# Patient Record
Sex: Female | Born: 1980
Health system: Southern US, Community
[De-identification: ages and names within clinical notes are randomized; demographics above are authoritative.]

## PROBLEM LIST (undated history)

## (undated) DIAGNOSIS — R7301 Impaired fasting glucose: Secondary | ICD-10-CM

## (undated) DIAGNOSIS — E663 Overweight: Secondary | ICD-10-CM

## (undated) DIAGNOSIS — F429 Obsessive-compulsive disorder, unspecified: Secondary | ICD-10-CM

## (undated) DIAGNOSIS — F17200 Nicotine dependence, unspecified, uncomplicated: Secondary | ICD-10-CM

## (undated) DIAGNOSIS — K589 Irritable bowel syndrome without diarrhea: Secondary | ICD-10-CM

## (undated) DIAGNOSIS — N870 Mild cervical dysplasia: Secondary | ICD-10-CM

## (undated) DIAGNOSIS — F988 Other specified behavioral and emotional disorders with onset usually occurring in childhood and adolescence: Secondary | ICD-10-CM

## (undated) DIAGNOSIS — J309 Allergic rhinitis, unspecified: Secondary | ICD-10-CM

## (undated) DIAGNOSIS — I7300 Raynaud's syndrome without gangrene: Secondary | ICD-10-CM

## (undated) HISTORY — DX: Mild cervical dysplasia: N87.0

## (undated) HISTORY — PX: COLPOSCOPY: SHX161

## (undated) HISTORY — DX: Allergic rhinitis, unspecified: J30.9

## (undated) HISTORY — DX: Nicotine dependence, unspecified, uncomplicated: F17.200

## (undated) HISTORY — DX: Impaired fasting glucose: R73.01

## (undated) HISTORY — DX: Other specified behavioral and emotional disorders with onset usually occurring in childhood and adolescence: F98.8

## (undated) HISTORY — DX: Raynaud's syndrome without gangrene: I73.00

## (undated) HISTORY — DX: Irritable bowel syndrome, unspecified: K58.9

## (undated) HISTORY — DX: Overweight: E66.3

## (undated) HISTORY — DX: Obsessive-compulsive disorder, unspecified: F42.9

---

## 1999-03-14 ENCOUNTER — Other Ambulatory Visit: Admission: RE | Admit: 1999-03-14 | Discharge: 1999-03-14 | Payer: Self-pay | Admitting: Obstetrics and Gynecology

## 2000-09-02 ENCOUNTER — Other Ambulatory Visit: Admission: RE | Admit: 2000-09-02 | Discharge: 2000-09-02 | Payer: Self-pay | Admitting: Gynecology

## 2001-12-09 ENCOUNTER — Other Ambulatory Visit: Admission: RE | Admit: 2001-12-09 | Discharge: 2001-12-09 | Payer: Self-pay | Admitting: Gynecology

## 2002-12-17 ENCOUNTER — Other Ambulatory Visit: Admission: RE | Admit: 2002-12-17 | Discharge: 2002-12-17 | Payer: Self-pay | Admitting: Gynecology

## 2003-12-30 ENCOUNTER — Other Ambulatory Visit: Admission: RE | Admit: 2003-12-30 | Discharge: 2003-12-30 | Payer: Self-pay | Admitting: Gynecology

## 2004-05-26 ENCOUNTER — Other Ambulatory Visit: Admission: RE | Admit: 2004-05-26 | Discharge: 2004-05-26 | Payer: Self-pay | Admitting: Obstetrics and Gynecology

## 2005-01-03 ENCOUNTER — Other Ambulatory Visit: Admission: RE | Admit: 2005-01-03 | Discharge: 2005-01-03 | Payer: Self-pay | Admitting: Obstetrics and Gynecology

## 2005-09-14 ENCOUNTER — Other Ambulatory Visit: Admission: RE | Admit: 2005-09-14 | Discharge: 2005-09-14 | Payer: Self-pay | Admitting: Obstetrics and Gynecology

## 2006-02-01 ENCOUNTER — Other Ambulatory Visit: Admission: RE | Admit: 2006-02-01 | Discharge: 2006-02-01 | Payer: Self-pay | Admitting: Obstetrics and Gynecology

## 2007-03-24 ENCOUNTER — Other Ambulatory Visit: Admission: RE | Admit: 2007-03-24 | Discharge: 2007-03-24 | Payer: Self-pay | Admitting: Obstetrics and Gynecology

## 2008-03-09 ENCOUNTER — Encounter: Admission: RE | Admit: 2008-03-09 | Discharge: 2008-03-09 | Payer: Self-pay | Admitting: Family Medicine

## 2008-04-07 ENCOUNTER — Other Ambulatory Visit: Admission: RE | Admit: 2008-04-07 | Discharge: 2008-04-07 | Payer: Self-pay | Admitting: Obstetrics and Gynecology

## 2009-04-29 ENCOUNTER — Encounter: Payer: Self-pay | Admitting: Obstetrics and Gynecology

## 2009-04-29 ENCOUNTER — Ambulatory Visit: Payer: Self-pay | Admitting: Obstetrics and Gynecology

## 2009-04-29 ENCOUNTER — Other Ambulatory Visit: Admission: RE | Admit: 2009-04-29 | Discharge: 2009-04-29 | Payer: Self-pay | Admitting: Obstetrics and Gynecology

## 2010-06-08 ENCOUNTER — Other Ambulatory Visit: Admission: RE | Admit: 2010-06-08 | Discharge: 2010-06-08 | Payer: Self-pay | Admitting: Obstetrics and Gynecology

## 2010-06-08 ENCOUNTER — Ambulatory Visit: Payer: Self-pay | Admitting: Obstetrics and Gynecology

## 2010-10-08 ENCOUNTER — Encounter: Payer: Self-pay | Admitting: Gastroenterology

## 2011-05-30 DIAGNOSIS — N870 Mild cervical dysplasia: Secondary | ICD-10-CM | POA: Insufficient documentation

## 2011-05-30 DIAGNOSIS — K589 Irritable bowel syndrome without diarrhea: Secondary | ICD-10-CM | POA: Insufficient documentation

## 2011-06-13 ENCOUNTER — Encounter: Payer: Self-pay | Admitting: Obstetrics and Gynecology

## 2011-06-13 ENCOUNTER — Other Ambulatory Visit (HOSPITAL_COMMUNITY)
Admission: RE | Admit: 2011-06-13 | Discharge: 2011-06-13 | Disposition: A | Discharge status: Home / Self Care | Payer: PRIVATE HEALTH INSURANCE | Source: Ambulatory Visit | Attending: Obstetrics and Gynecology | Admitting: Obstetrics and Gynecology

## 2011-06-13 ENCOUNTER — Ambulatory Visit (INDEPENDENT_AMBULATORY_CARE_PROVIDER_SITE_OTHER): Payer: PRIVATE HEALTH INSURANCE | Admitting: Obstetrics and Gynecology

## 2011-06-13 DIAGNOSIS — Z01419 Encounter for gynecological examination (general) (routine) without abnormal findings: Secondary | ICD-10-CM | POA: Insufficient documentation

## 2011-06-13 DIAGNOSIS — N390 Urinary tract infection, site not specified: Secondary | ICD-10-CM

## 2011-06-13 DIAGNOSIS — Z113 Encounter for screening for infections with a predominantly sexual mode of transmission: Secondary | ICD-10-CM

## 2011-06-13 NOTE — Progress Notes (Signed)
Addended by: Landis Martins R on: 06/13/2011 10:06 AM   Modules accepted: Orders

## 2011-06-13 NOTE — Progress Notes (Signed)
Patient came to see me today for her annual GYN exam. She's done well with her NuvaRing. She's been with a new partner for 3 months. He is larger when they have intercourse. She has noticed since she's been with him some urgency and frequency of urination. It's actually better now.  Physical examination: HEENT within normal limits. Neck: Thyroid not large. No masses. Supraclavicular nodes: not enlarged. Breasts: Examined in both sitting midline position. No skin changes and no masses. Abdomen: Soft no guarding rebound or masses or hernia. Pelvic: External: Within normal limits. BUS: Within normal limits. Vaginal:within normal limits. Good estrogen effect. No evidence of cystocele rectocele or enterocele. Cervix: clean. Uterus: Normal size and shape. Adnexa: No masses. Rectovaginal exam: Confirmatory and negative. Extremities: Within normal limits.  Assessment: Possible UTI in spite of a negative urine today.  Plan: Based on her symptoms I have treated her with Macrobid twice a day with food for 7 days. We did a urine culture. We discussed her emptying her bladder after intercourse. We discussed post coital antibiotics but will not start yet. We checked her for GC and Chlamydia but she will donate blood for the other STD testing. She will continue her NuvaRing and a yearly prescription was written.

## 2011-07-03 ENCOUNTER — Other Ambulatory Visit: Payer: Self-pay | Admitting: Obstetrics and Gynecology

## 2011-08-22 ENCOUNTER — Telehealth: Payer: Self-pay | Admitting: *Deleted

## 2011-08-22 NOTE — Telephone Encounter (Signed)
Patient states she has accidentally taken her Nuvaring out a week early.  (next week was supposed to be her placebo week) So now that she's on her period this week should she put in new ring the end of this week and carry on just like this would be her regular time to replace? Any concerns she should have?

## 2011-08-22 NOTE — Telephone Encounter (Signed)
I assume she only had her NuvaRing in for 2 weeks. Please verify. I would leave it out for 5 days and then put a new ring in for 3 weeks. Just to be sure I would use a condom with intercourse.

## 2011-08-22 NOTE — Telephone Encounter (Signed)
Patient informed will pick up sample at front desk.  Will insert this Sunday.  She did indeed have it in for two weeks.

## 2012-06-26 ENCOUNTER — Other Ambulatory Visit: Payer: Self-pay | Admitting: Obstetrics and Gynecology

## 2012-07-09 ENCOUNTER — Other Ambulatory Visit (HOSPITAL_COMMUNITY)
Admission: RE | Admit: 2012-07-09 | Discharge: 2012-07-09 | Disposition: A | Discharge status: Home / Self Care | Payer: PRIVATE HEALTH INSURANCE | Source: Ambulatory Visit | Attending: Obstetrics and Gynecology | Admitting: Obstetrics and Gynecology

## 2012-07-09 ENCOUNTER — Encounter: Payer: Self-pay | Admitting: Obstetrics and Gynecology

## 2012-07-09 ENCOUNTER — Ambulatory Visit (INDEPENDENT_AMBULATORY_CARE_PROVIDER_SITE_OTHER): Payer: PRIVATE HEALTH INSURANCE | Admitting: Obstetrics and Gynecology

## 2012-07-09 VITALS — BP 116/72 | Ht 65.0 in | Wt 136.0 lb

## 2012-07-09 DIAGNOSIS — Z113 Encounter for screening for infections with a predominantly sexual mode of transmission: Secondary | ICD-10-CM

## 2012-07-09 DIAGNOSIS — Z01419 Encounter for gynecological examination (general) (routine) without abnormal findings: Secondary | ICD-10-CM | POA: Insufficient documentation

## 2012-07-09 DIAGNOSIS — Z1151 Encounter for screening for human papillomavirus (HPV): Secondary | ICD-10-CM | POA: Insufficient documentation

## 2012-07-09 LAB — CBC WITH DIFFERENTIAL/PLATELET
Eosinophils Absolute: 0.1 10*3/uL (ref 0.0–0.7)
Eosinophils Relative: 1 % (ref 0–5)
Hemoglobin: 14.3 g/dL (ref 12.0–15.0)
Lymphocytes Relative: 31 % (ref 12–46)
Lymphs Abs: 3.2 10*3/uL (ref 0.7–4.0)
MCH: 31 pg (ref 26.0–34.0)
MCV: 92 fL (ref 78.0–100.0)
Monocytes Relative: 9 % (ref 3–12)
Platelets: 337 10*3/uL (ref 150–400)
RBC: 4.62 MIL/uL (ref 3.87–5.11)
WBC: 10.3 10*3/uL (ref 4.0–10.5)

## 2012-07-09 MED ORDER — ETONOGESTREL-ETHINYL ESTRADIOL 0.12-0.015 MG/24HR VA RING: VAGINAL_RING | VAGINAL | Status: DC

## 2012-07-09 NOTE — Patient Instructions (Signed)
Try to stop smoking. °

## 2012-07-09 NOTE — Progress Notes (Signed)
Patient came to see me today for her annual GYN exam. She is having regular cycles with her NuvaRing. She continues to try to stop smoking and has reduced it significantly. She's currently not sexually active but has been since her last visit here. 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 Since 2006 she has had yearly normal Pap smears. She is having no pelvic pain. She is having no unusual bleeding.  Physical examination:Angel Casey present. HEENT within normal limits. Neck: Thyroid not large. No masses. Supraclavicular nodes: not enlarged. Breasts: Examined in both sitting and lying  position. No skin changes and no masses. Abdomen: Soft no guarding rebound or masses or hernia. Pelvic: External: Within normal limits. BUS: Within normal limits. Vaginal:within normal limits. Good estrogen effect. No evidence of cystocele rectocele or enterocele. Cervix: clean. Uterus: Normal size and shape. Adnexa: No masses. Rectovaginal exam: Confirmatory and negative. Extremities: Within normal limits.  Assessment: #1. Normal GYN exam #2. CIN-1 now resolved  Plan: Continue nuvaring. Stop smoking. Pap and highrisk HPV testing done.The new Pap smear guidelines were discussed with the patient.

## 2012-07-10 LAB — URINALYSIS W MICROSCOPIC + REFLEX CULTURE
Hgb urine dipstick: NEGATIVE
Leukocytes, UA: NEGATIVE
Nitrite: NEGATIVE
Protein, ur: NEGATIVE mg/dL
Squamous Epithelial / LPF: NONE SEEN
Urobilinogen, UA: 0.2 mg/dL (ref 0.0–1.0)

## 2012-07-10 LAB — GC/CHLAMYDIA PROBE AMP, GENITAL: Chlamydia, DNA Probe: NEGATIVE

## 2012-07-17 ENCOUNTER — Telehealth: Payer: Self-pay | Admitting: *Deleted

## 2012-07-17 NOTE — Telephone Encounter (Signed)
Pt informed with recent blood work on 07/09/12.

## 2013-03-18 ENCOUNTER — Telehealth: Payer: Self-pay | Admitting: *Deleted

## 2013-03-18 NOTE — Telephone Encounter (Signed)
PT CALLED REQUESTING SAMPLE OF NUVARING. PT WILL COME BY TO PICK UP 1 SAMPLE.

## 2013-07-15 ENCOUNTER — Encounter: Payer: Self-pay | Admitting: Women's Health

## 2013-07-15 ENCOUNTER — Ambulatory Visit (INDEPENDENT_AMBULATORY_CARE_PROVIDER_SITE_OTHER): Payer: PRIVATE HEALTH INSURANCE | Admitting: Women's Health

## 2013-07-15 VITALS — BP 114/74 | Ht 65.0 in | Wt 141.4 lb

## 2013-07-15 DIAGNOSIS — IMO0001 Reserved for inherently not codable concepts without codable children: Secondary | ICD-10-CM

## 2013-07-15 DIAGNOSIS — Z309 Encounter for contraceptive management, unspecified: Secondary | ICD-10-CM

## 2013-07-15 DIAGNOSIS — Z01419 Encounter for gynecological examination (general) (routine) without abnormal findings: Secondary | ICD-10-CM

## 2013-07-15 DIAGNOSIS — Z113 Encounter for screening for infections with a predominantly sexual mode of transmission: Secondary | ICD-10-CM

## 2013-07-15 DIAGNOSIS — N870 Mild cervical dysplasia: Secondary | ICD-10-CM

## 2013-07-15 LAB — CBC WITH DIFFERENTIAL/PLATELET
Hemoglobin: 13.5 g/dL (ref 12.0–15.0)
Lymphocytes Relative: 42 % (ref 12–46)
Lymphs Abs: 2.7 10*3/uL (ref 0.7–4.0)
MCH: 31.6 pg (ref 26.0–34.0)
MCV: 91.3 fL (ref 78.0–100.0)
Monocytes Relative: 8 % (ref 3–12)
Neutrophils Relative %: 48 % (ref 43–77)
Platelets: 361 10*3/uL (ref 150–400)
RBC: 4.27 MIL/uL (ref 3.87–5.11)
WBC: 6.4 10*3/uL (ref 4.0–10.5)

## 2013-07-15 LAB — HIV ANTIBODY (ROUTINE TESTING W REFLEX): HIV: NONREACTIVE

## 2013-07-15 LAB — HEPATITIS B SURFACE ANTIGEN: Hepatitis B Surface Ag: NEGATIVE

## 2013-07-15 MED ORDER — ETONOGESTREL-ETHINYL ESTRADIOL 0.12-0.015 MG/24HR VA RING: VAGINAL_RING | VAGINAL | Status: DC

## 2013-07-15 NOTE — Progress Notes (Signed)
Angel Casey 1981/04/11 [id number]  History:    The patient presents for annual exam.  Monthly cycle on NuvaRing. Quit smoking 10 months ago. CIN-1 2005 with normal Paps after. Pap negative with negative HR HPV 06/2012. History of IBS. Gardasil series completed when 24. New partner, history of HSV on Valtrex suppression.  Past medical history, past surgical history, family history and social history were all reviewed and documented in the EPIC chart. Works for. [employer].   ROS:  A  ROS was performed and pertinent positives and negatives are included in the history.  Exam:  Filed Vitals:   07/15/13 0833  BP: 114/74    General appearance:  Normal Head/Neck:  Normal, without cervical or supraclavicular adenopathy. Thyroid:  Symmetrical, normal in size, without palpable masses or nodularity. Respiratory  Effort:  Normal  Auscultation:  Clear without wheezing or rhonchi Cardiovascular  Auscultation:  Regular rate, without rubs, murmurs or gallops  Edema/varicosities:  Not grossly evident Abdominal  Soft,nontender, without masses, guarding or rebound.  Liver/spleen:  No organomegaly noted  Hernia:  None appreciated  Skin  Inspection:  Grossly normal  Palpation:  Grossly normal Neurologic/psychiatric  Orientation:  Normal with appropriate conversation.  Mood/affect:  Normal  Genitourinary    Breasts: Examined lying and sitting.     Right: Without masses, retractions, discharge or axillary adenopathy.     Left: Without masses, retractions, discharge or axillary adenopathy.   Inguinal/mons:  Normal without inguinal adenopathy  External genitalia:  Normal  BUS/Urethra/Skene's glands:  Normal  Bladder:  Normal  Vagina:  Normal  Cervix:  Normal  Uterus:  normal in size, shape and contour.  Midline and mobile  Adnexa/parametria:     Rt: Without masses or tenderness.   Lt: Without masses or tenderness.  Anus and perineum: Normal  Digital rectal exam: Normal sphincter  tone without palpated masses or tenderness  Assessment/Plan:  32 y.o.SWF G0  for annual exam.    CIN 1 2005 normal Paps after STD screen  Plan: NuvaRing prescription, proper use, slight risk for blood clots and strokes reviewed. Condoms encouraged until permanent partner.  SBE's, continue regular exercise, calcium rich diet, MVI daily encouraged. CBC, UA, GC/Chlamydia, HIV, hep B, C., RPR, HSV IgG IGM. Pap normal with negative HR HPV 2013, new screening guidelines reviewed.    Harrington Challenger [facility], 12:50 PM 07/15/2013

## 2013-07-15 NOTE — Patient Instructions (Signed)

## 2013-07-16 LAB — GC/CHLAMYDIA PROBE AMP
CT Probe RNA: NEGATIVE
GC Probe RNA: NEGATIVE

## 2013-07-16 LAB — HSV(HERPES SMPLX)ABS-I+II(IGG+IGM)-BLD
HSV 1 Glycoprotein G Ab, IgG: 0.12 IV
Herpes Simplex Vrs I&II-IgM Ab (EIA): 0.76 INDEX

## 2013-07-16 LAB — URINALYSIS W MICROSCOPIC + REFLEX CULTURE
Bacteria, UA: NONE SEEN
Casts: NONE SEEN
Crystals: NONE SEEN
Glucose, UA: NEGATIVE mg/dL
Hgb urine dipstick: NEGATIVE
Leukocytes, UA: NEGATIVE
Specific Gravity, Urine: 1.01 (ref 1.005–1.030)
Squamous Epithelial / LPF: NONE SEEN
Urobilinogen, UA: 0.2 mg/dL (ref 0.0–1.0)
pH: 6.5 (ref 5.0–8.0)

## 2013-07-30 ENCOUNTER — Telehealth: Payer: Self-pay | Admitting: *Deleted

## 2013-07-30 NOTE — Telephone Encounter (Signed)
Telephone call, denies any redness, swelling, pain. States has had a pulsation feeling. Will watch at this time.

## 2013-07-30 NOTE — Telephone Encounter (Signed)
Message left

## 2013-07-30 NOTE — Telephone Encounter (Signed)
Pt called c/o a pulse in her lower thigh? Pt noticed 2 days ago then went away and came back, pt uses nuvaring and is nervous blood clot, DVT. Pt said this issue caused her not to sleep last night due discomfort. I recommend OV to pt, pt thought maybe you could speak with her via phone about this issue? [phone] please advise

## 2014-04-22 ENCOUNTER — Telehealth: Payer: Self-pay | Admitting: *Deleted

## 2014-04-22 NOTE — Telephone Encounter (Signed)
Pt wont have insurance until next month requesting 1 sample of nuvaring. I called pt back and told her okay, pt will come pick tomorrow.

## 2014-07-16 ENCOUNTER — Encounter: Payer: Self-pay | Admitting: Women's Health

## 2014-07-16 ENCOUNTER — Other Ambulatory Visit: Payer: Self-pay | Admitting: Gynecology

## 2014-07-16 ENCOUNTER — Ambulatory Visit (INDEPENDENT_AMBULATORY_CARE_PROVIDER_SITE_OTHER): Payer: Managed Care, Other (non HMO) | Admitting: Women's Health

## 2014-07-16 VITALS — BP 118/78 | Ht 65.0 in | Wt 141.0 lb

## 2014-07-16 DIAGNOSIS — Z13220 Encounter for screening for lipoid disorders: Secondary | ICD-10-CM

## 2014-07-16 DIAGNOSIS — Z01419 Encounter for gynecological examination (general) (routine) without abnormal findings: Secondary | ICD-10-CM

## 2014-07-16 DIAGNOSIS — Z833 Family history of diabetes mellitus: Secondary | ICD-10-CM

## 2014-07-16 DIAGNOSIS — L920 Granuloma annulare: Secondary | ICD-10-CM | POA: Insufficient documentation

## 2014-07-16 DIAGNOSIS — Z3040 Encounter for surveillance of contraceptives, unspecified: Secondary | ICD-10-CM

## 2014-07-16 LAB — URINALYSIS W MICROSCOPIC + REFLEX CULTURE
BACTERIA UA: NONE SEEN
Bilirubin Urine: NEGATIVE
Casts: NONE SEEN
Crystals: NONE SEEN
Glucose, UA: NEGATIVE mg/dL
HGB URINE DIPSTICK: NEGATIVE
KETONES UR: NEGATIVE mg/dL
Leukocytes, UA: NEGATIVE
Nitrite: NEGATIVE
Protein, ur: NEGATIVE mg/dL
Specific Gravity, Urine: 1.009 (ref 1.005–1.030)
Squamous Epithelial / LPF: NONE SEEN
Urobilinogen, UA: 0.2 mg/dL (ref 0.0–1.0)
pH: 7.5 (ref 5.0–8.0)

## 2014-07-16 LAB — CBC WITH DIFFERENTIAL/PLATELET
Basophils Absolute: 0 10*3/uL (ref 0.0–0.1)
Basophils Relative: 0 % (ref 0–1)
Eosinophils Absolute: 0.1 10*3/uL (ref 0.0–0.7)
Eosinophils Relative: 1 % (ref 0–5)
HEMATOCRIT: 40 % (ref 36.0–46.0)
Hemoglobin: 13.1 g/dL (ref 12.0–15.0)
LYMPHS PCT: 33 % (ref 12–46)
Lymphs Abs: 2.5 10*3/uL (ref 0.7–4.0)
MCH: 30.3 pg (ref 26.0–34.0)
MCHC: 32.8 g/dL (ref 30.0–36.0)
MCV: 92.6 fL (ref 78.0–100.0)
MONO ABS: 0.6 10*3/uL (ref 0.1–1.0)
MONOS PCT: 8 % (ref 3–12)
Neutro Abs: 4.4 10*3/uL (ref 1.7–7.7)
Neutrophils Relative %: 58 % (ref 43–77)
Platelets: 329 10*3/uL (ref 150–400)
RBC: 4.32 MIL/uL (ref 3.87–5.11)
RDW: 13.1 % (ref 11.5–15.5)
WBC: 7.6 10*3/uL (ref 4.0–10.5)

## 2014-07-16 MED ORDER — ETONOGESTREL-ETHINYL ESTRADIOL 0.12-0.015 MG/24HR VA RING: VAGINAL_RING | VAGINAL | Status: DC

## 2014-07-16 NOTE — Progress Notes (Signed)
Angel Casey [birth year] [id number]    History:    Presents for annual exam.  Monthly cycle on NuvaRing. Quit smoking 2 years ago. 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 06/2012 Pap negative HR HPV. Not sexually active. Denies vaginal discharge/odor. 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    Past medical history, past surgical history, family history and social history were all reviewed and documented in the EPIC chart.  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 Father diabetes.   ROS:  A  12 point ROS was performed and pertinent positives and negatives are included.  Exam:  Filed Vitals:   07/16/14 0822  BP: 118/78    General appearance:  Normal Thyroid:  Symmetrical, normal in size, without palpable masses or nodularity. Respiratory  Auscultation:  Clear without wheezing or rhonchi Cardiovascular  Auscultation:  Regular rate, without rubs, murmurs or gallops  Edema/varicosities:  Not grossly evident Abdominal  Soft,nontender, without masses, guarding or rebound.  Liver/spleen:  No organomegaly noted  Hernia:  None appreciated  Skin  Inspection:  Grossly normal, numerous patches   Breasts: Examined lying and sitting.     Right: Without masses, retractions, discharge or axillary adenopathy.     Left: Without masses, retractions, discharge or axillary adenopathy. Gentitourinary   Inguinal/mons:  Normal without inguinal adenopathy  External genitalia:  Normal  BUS/Urethra/Skene's glands:  Normal  Vagina:  Normal  Cervix:  Normal  Uterus:  normal in size, shape and contour.  Midline and mobile  Adnexa/parametria:     Rt: Without masses or tenderness.   Lt: Without masses or tenderness.  Anus and perineum: Normal  Digital rectal exam: Normal sphincter tone without palpated masses or tenderness  Assessment/Plan:  33 y.o. SWF G0 for annual exam.    CIN 1 2005 normal Paps after  Granuloma annulare-Followed by dermatologist  Plan:  NuvaRing prescription, proper use, slight risk for blood clots and strokes reviewed. Condoms encouraged until permanent partner. SBE's, continue regular exercise, MVI daily encouraged. Discussed 3D mammogram starting at age 34. CBC, glucose, lipid panel, UA. Pap today, Pap normal with negative HR HPV 2013, new screening guidelines reviewed.    Harrington ChallengerYOUNG,Ariston Grandison J WHNP, 9:05 AM 07/16/2014

## 2014-07-16 NOTE — Patient Instructions (Signed)

## 2014-07-17 LAB — COMPREHENSIVE METABOLIC PANEL
ALT: 9 U/L (ref 0–35)
AST: 15 U/L (ref 0–37)
Albumin: 4.4 g/dL (ref 3.5–5.2)
Alkaline Phosphatase: 33 U/L — ABNORMAL LOW (ref 39–117)
BILIRUBIN TOTAL: 0.5 mg/dL (ref 0.2–1.2)
BUN: 12 mg/dL (ref 6–23)
CO2: 21 mEq/L (ref 19–32)
Calcium: 9.4 mg/dL (ref 8.4–10.5)
Chloride: 103 mEq/L (ref 96–112)
Creat: 0.73 mg/dL (ref 0.50–1.10)
Glucose, Bld: 93 mg/dL (ref 70–99)
Potassium: 4.6 mEq/L (ref 3.5–5.3)
SODIUM: 137 meq/L (ref 135–145)
TOTAL PROTEIN: 6.7 g/dL (ref 6.0–8.3)

## 2014-07-17 LAB — LIPID PANEL
CHOL/HDL RATIO: 2.3 ratio
Cholesterol: 169 mg/dL (ref 0–200)
HDL: 73 mg/dL (ref >39)
LDL CALC: 82 mg/dL (ref 0–99)
Triglycerides: 71 mg/dL (ref <150)
VLDL: 14 mg/dL (ref 0–40)

## 2014-07-20 ENCOUNTER — Other Ambulatory Visit (HOSPITAL_COMMUNITY)
Admission: RE | Admit: 2014-07-20 | Discharge: 2014-07-20 | Disposition: A | Discharge status: Home / Self Care | Payer: Managed Care, Other (non HMO) | Source: Ambulatory Visit | Attending: Gynecology | Admitting: Gynecology

## 2014-07-20 DIAGNOSIS — Z01419 Encounter for gynecological examination (general) (routine) without abnormal findings: Secondary | ICD-10-CM | POA: Diagnosis present

## 2014-07-20 NOTE — Addendum Note (Signed)
Addended by: Kem ParkinsonBARNES, Nandana Krolikowski on: 07/20/2014 08:05 AM   Modules accepted: Orders, SmartSet

## 2014-07-21 LAB — CYTOLOGY - PAP

## 2014-08-05 ENCOUNTER — Other Ambulatory Visit: Payer: Self-pay

## 2014-08-05 DIAGNOSIS — Z3040 Encounter for surveillance of contraceptives, unspecified: Secondary | ICD-10-CM

## 2014-08-05 MED ORDER — ETONOGESTREL-ETHINYL ESTRADIOL 0.12-0.015 MG/24HR VA RING: VAGINAL_RING | VAGINAL | Status: DC

## 2014-11-09 ENCOUNTER — Ambulatory Visit (INDEPENDENT_AMBULATORY_CARE_PROVIDER_SITE_OTHER): Payer: BLUE CROSS/BLUE SHIELD | Admitting: Women's Health

## 2014-11-09 ENCOUNTER — Telehealth: Payer: Self-pay | Admitting: *Deleted

## 2014-11-09 ENCOUNTER — Encounter: Payer: Self-pay | Admitting: Women's Health

## 2014-11-09 VITALS — BP 122/80 | Ht 65.0 in | Wt 141.0 lb

## 2014-11-09 DIAGNOSIS — R350 Frequency of micturition: Secondary | ICD-10-CM

## 2014-11-09 DIAGNOSIS — B373 Candidiasis of vulva and vagina: Secondary | ICD-10-CM

## 2014-11-09 DIAGNOSIS — B3731 Acute candidiasis of vulva and vagina: Secondary | ICD-10-CM

## 2014-11-09 DIAGNOSIS — Z113 Encounter for screening for infections with a predominantly sexual mode of transmission: Secondary | ICD-10-CM

## 2014-11-09 LAB — WET PREP FOR TRICH, YEAST, CLUE
Clue Cells Wet Prep HPF POC: NONE SEEN
Trich, Wet Prep: NONE SEEN
Yeast Wet Prep HPF POC: NONE SEEN

## 2014-11-09 LAB — URINALYSIS W MICROSCOPIC + REFLEX CULTURE
Bilirubin Urine: NEGATIVE
Casts: NONE SEEN
Crystals: NONE SEEN
Glucose, UA: NEGATIVE mg/dL
Ketones, ur: NEGATIVE mg/dL
Leukocytes, UA: NEGATIVE
Nitrite: NEGATIVE
Protein, ur: NEGATIVE mg/dL
Specific Gravity, Urine: <1.005 — ABNORMAL LOW (ref 1.005–1.030)
Urobilinogen, UA: 0.2 mg/dL (ref 0.0–1.0)
pH: 5 (ref 5.0–8.0)

## 2014-11-09 MED ORDER — FLUCONAZOLE 150 MG PO TABS: 150.0000 mg | ORAL_TABLET | Freq: Once | ORAL | Status: DC

## 2014-11-09 NOTE — Addendum Note (Signed)
Addended by: Kem ParkinsonBARNES, Franko Hilliker on: 11/09/2014 11:54 AM   Modules accepted: Orders

## 2014-11-09 NOTE — Progress Notes (Signed)
Patient ID: Angel Casey, female   DOB: 12-15-1980, 34 y.o.   MRN: [id number] Presents with several issues. Monthly cycle on NuvaRing/new partner. Increased urinary frequency without pain or burning, scant discharge with mild itching and odor for past several days. Denies abdominal pain or fever.  Exam: Appears well. Right fractured foot in a boot, has follow-up scheduled. External genitalia within normal limits, speculum exam scant white discharge, mild erythema, no odor, GC/Chlamydia culture taken. Wet prep negative. Bimanual no CMT or adnexal tenderness. UA: Trace blood, 3-6 WBCs, rare bacteria, 0-2 RBCs.  Clinical yeast STD screen  Plan: Urine culture and GC/Chlamydia culture pending. Declines need for HIV, hepatitis or RPR. Diflucan 150 by mouth 1 dose. Instructed to call if symptoms do not resolve.

## 2014-11-09 NOTE — Telephone Encounter (Signed)
Pt called and left message in voicemail c/o UTI. I called and left message to make OV with nancy.

## 2014-11-09 NOTE — Patient Instructions (Signed)

## 2014-11-10 ENCOUNTER — Other Ambulatory Visit: Payer: Self-pay | Admitting: Women's Health

## 2014-11-10 DIAGNOSIS — N3000 Acute cystitis without hematuria: Secondary | ICD-10-CM

## 2014-11-10 LAB — GC/CHLAMYDIA PROBE AMP
CT Probe RNA: NEGATIVE
GC Probe RNA: NEGATIVE

## 2014-11-10 MED ORDER — SULFAMETHOXAZOLE-TRIMETHOPRIM 800-160 MG PO TABS: 1.0000 | ORAL_TABLET | Freq: Two times a day (BID) | ORAL | Status: DC

## 2014-11-11 LAB — URINE CULTURE
Colony Count: NO GROWTH
Organism ID, Bacteria: NO GROWTH

## 2015-06-01 ENCOUNTER — Telehealth: Payer: Self-pay | Admitting: *Deleted

## 2015-06-01 MED ORDER — FLUCONAZOLE 150 MG PO TABS: 150.0000 mg | ORAL_TABLET | Freq: Once | ORAL | Status: DC

## 2015-06-01 NOTE — Telephone Encounter (Signed)
Pt informed with the below note, Rx sent. 

## 2015-06-01 NOTE — Telephone Encounter (Signed)
Pt called c/o yeast infection itching and white discharge. Asked if diflucan tablet can be sent to pharmacy. Please advise

## 2015-06-01 NOTE — Telephone Encounter (Signed)
Okay for Diflucan office visit if no relief.

## 2015-08-22 ENCOUNTER — Other Ambulatory Visit: Payer: Self-pay | Admitting: *Deleted

## 2015-08-22 DIAGNOSIS — Z3040 Encounter for surveillance of contraceptives, unspecified: Secondary | ICD-10-CM

## 2015-08-22 MED ORDER — ETONOGESTREL-ETHINYL ESTRADIOL 0.12-0.015 MG/24HR VA RING: VAGINAL_RING | VAGINAL | Status: DC

## 2015-08-22 NOTE — Telephone Encounter (Signed)
Pt has annual scheduled on 09/01/15, needs refill on nuvaring, Rx sent.

## 2015-09-01 ENCOUNTER — Other Ambulatory Visit (HOSPITAL_COMMUNITY)
Admission: RE | Admit: 2015-09-01 | Discharge: 2015-09-01 | Disposition: A | Discharge status: Home / Self Care | Payer: BLUE CROSS/BLUE SHIELD | Source: Ambulatory Visit | Attending: Women's Health | Admitting: Women's Health

## 2015-09-01 ENCOUNTER — Ambulatory Visit (INDEPENDENT_AMBULATORY_CARE_PROVIDER_SITE_OTHER): Payer: BLUE CROSS/BLUE SHIELD | Admitting: Women's Health

## 2015-09-01 ENCOUNTER — Encounter: Payer: Self-pay | Admitting: Women's Health

## 2015-09-01 VITALS — BP 128/80 | Ht 65.0 in | Wt 143.0 lb

## 2015-09-01 DIAGNOSIS — Z833 Family history of diabetes mellitus: Secondary | ICD-10-CM | POA: Diagnosis not present

## 2015-09-01 DIAGNOSIS — Z113 Encounter for screening for infections with a predominantly sexual mode of transmission: Secondary | ICD-10-CM

## 2015-09-01 DIAGNOSIS — Z3040 Encounter for surveillance of contraceptives, unspecified: Secondary | ICD-10-CM | POA: Diagnosis not present

## 2015-09-01 DIAGNOSIS — Z1151 Encounter for screening for human papillomavirus (HPV): Secondary | ICD-10-CM | POA: Insufficient documentation

## 2015-09-01 DIAGNOSIS — Z01419 Encounter for gynecological examination (general) (routine) without abnormal findings: Secondary | ICD-10-CM | POA: Diagnosis not present

## 2015-09-01 LAB — TSH: TSH: 2.171 u[IU]/mL (ref 0.350–4.500)

## 2015-09-01 MED ORDER — ETONOGESTREL-ETHINYL ESTRADIOL 0.12-0.015 MG/24HR VA RING: VAGINAL_RING | VAGINAL | Status: DC

## 2015-09-01 NOTE — Patient Instructions (Signed)
Health Maintenance, Female Adopting a healthy lifestyle and getting preventive care can go a long way to promote health and wellness. Talk with your health care provider about what schedule of regular examinations is right for you. This is a good chance for you to check in with your provider about disease prevention and staying healthy. In between checkups, there are plenty of things you can do on your own. Experts have done a lot of research about which lifestyle changes and preventive measures are most likely to keep you healthy. Ask your health care provider for more information. WEIGHT AND DIET  Eat a healthy diet  Be sure to include plenty of vegetables, fruits, low-fat dairy products, and lean protein.  Do not eat a lot of foods high in solid fats, added sugars, or salt.  Get regular exercise. This is one of the most important things you can do for your health.  Most adults should exercise for at least 150 minutes each week. The exercise should increase your heart rate and make you sweat (moderate-intensity exercise).  Most adults should also do strengthening exercises at least twice a week. This is in addition to the moderate-intensity exercise.  Maintain a healthy weight  Body mass index (BMI) is a measurement that can be used to identify possible weight problems. It estimates body fat based on height and weight. Your health care provider can help determine your BMI and help you achieve or maintain a healthy weight.  For females 20 years of age and older:   A BMI below 18.5 is considered underweight.  A BMI of 18.5 to 24.9 is normal.  A BMI of 25 to 29.9 is considered overweight.  A BMI of 30 and above is considered obese.  Watch levels of cholesterol and blood lipids  You should start having your blood tested for lipids and cholesterol at 35 years of age, then have this test every 5 years.  You may need to have your cholesterol levels checked more often if:  Your lipid  or cholesterol levels are high.  You are older than 35 years of age.  You are at high risk for heart disease.  CANCER SCREENING   Lung Cancer  Lung cancer screening is recommended for adults 55-80 years old who are at high risk for lung cancer because of a history of smoking.  A yearly low-dose CT scan of the lungs is recommended for people who:  Currently smoke.  Have quit within the past 15 years.  Have at least a 30-pack-year history of smoking. A pack year is smoking an average of one pack of cigarettes a day for 1 year.  Yearly screening should continue until it has been 15 years since you quit.  Yearly screening should stop if you develop a health problem that would prevent you from having lung cancer treatment.  Breast Cancer  Practice breast self-awareness. This means understanding how your breasts normally appear and feel.  It also means doing regular breast self-exams. Let your health care provider know about any changes, no matter how small.  If you are in your 20s or 30s, you should have a clinical breast exam (CBE) by a health care provider every 1-3 years as part of a regular health exam.  If you are 40 or older, have a CBE every year. Also consider having a breast X-ray (mammogram) every year.  If you have a family history of breast cancer, talk to your health care provider about genetic screening.  If you   are at high risk for breast cancer, talk to your health care provider about having an MRI and a mammogram every year.  Breast cancer gene (BRCA) assessment is recommended for women who have family members with BRCA-related cancers. BRCA-related cancers include:  Breast.  Ovarian.  Tubal.  Peritoneal cancers.  Results of the assessment will determine the need for genetic counseling and BRCA1 and BRCA2 testing. Cervical Cancer Your health care provider may recommend that you be screened regularly for cancer of the pelvic organs (ovaries, uterus, and  vagina). This screening involves a pelvic examination, including checking for microscopic changes to the surface of your cervix (Pap test). You may be encouraged to have this screening done every 3 years, beginning at age 21.  For women ages 30-65, health care providers may recommend pelvic exams and Pap testing every 3 years, or they may recommend the Pap and pelvic exam, combined with testing for human papilloma virus (HPV), every 5 years. Some types of HPV increase your risk of cervical cancer. Testing for HPV may also be done on women of any age with unclear Pap test results.  Other health care providers may not recommend any screening for nonpregnant women who are considered low risk for pelvic cancer and who do not have symptoms. Ask your health care provider if a screening pelvic exam is right for you.  If you have had past treatment for cervical cancer or a condition that could lead to cancer, you need Pap tests and screening for cancer for at least 20 years after your treatment. If Pap tests have been discontinued, your risk factors (such as having a new sexual partner) need to be reassessed to determine if screening should resume. Some women have medical problems that increase the chance of getting cervical cancer. In these cases, your health care provider may recommend more frequent screening and Pap tests. Colorectal Cancer  This type of cancer can be detected and often prevented.  Routine colorectal cancer screening usually begins at 35 years of age and continues through 35 years of age.  Your health care provider may recommend screening at an earlier age if you have risk factors for colon cancer.  Your health care provider may also recommend using home test kits to check for hidden blood in the stool.  A small camera at the end of a tube can be used to examine your colon directly (sigmoidoscopy or colonoscopy). This is done to check for the earliest forms of colorectal  cancer.  Routine screening usually begins at age 50.  Direct examination of the colon should be repeated every 5-10 years through 35 years of age. However, you may need to be screened more often if early forms of precancerous polyps or small growths are found. Skin Cancer  Check your skin from head to toe regularly.  Tell your health care provider about any new moles or changes in moles, especially if there is a change in a mole's shape or color.  Also tell your health care provider if you have a mole that is larger than the size of a pencil eraser.  Always use sunscreen. Apply sunscreen liberally and repeatedly throughout the day.  Protect yourself by wearing long sleeves, pants, a wide-brimmed hat, and sunglasses whenever you are outside. HEART DISEASE, DIABETES, AND HIGH BLOOD PRESSURE   High blood pressure causes heart disease and increases the risk of stroke. High blood pressure is more likely to develop in:  People who have blood pressure in the high end   of the normal range (130-139/85-89 mm Hg).  People who are overweight or obese.  People who are African American.  If you are 38-23 years of age, have your blood pressure checked every 3-5 years. If you are 61 years of age or older, have your blood pressure checked every year. You should have your blood pressure measured twice--once when you are at a hospital or clinic, and once when you are not at a hospital or clinic. Record the average of the two measurements. To check your blood pressure when you are not at a hospital or clinic, you can use:  An automated blood pressure machine at a pharmacy.  A home blood pressure monitor.  If you are between 45 years and 39 years old, ask your health care provider if you should take aspirin to prevent strokes.  Have regular diabetes screenings. This involves taking a blood sample to check your fasting blood sugar level.  If you are at a normal weight and have a low risk for diabetes,  have this test once every three years after 35 years of age.  If you are overweight and have a high risk for diabetes, consider being tested at a younger age or more often. PREVENTING INFECTION  Hepatitis B  If you have a higher risk for hepatitis B, you should be screened for this virus. You are considered at high risk for hepatitis B if:  You were born in a country where hepatitis B is common. Ask your health care provider which countries are considered high risk.  Your parents were born in a high-risk country, and you have not been immunized against hepatitis B (hepatitis B vaccine).  You have HIV or AIDS.  You use needles to inject street drugs.  You live with someone who has hepatitis B.  You have had sex with someone who has hepatitis B.  You get hemodialysis treatment.  You take certain medicines for conditions, including cancer, organ transplantation, and autoimmune conditions. Hepatitis C  Blood testing is recommended for:  Everyone born from [birth year] through 1966.  Anyone with known risk factors for hepatitis C. Sexually transmitted infections (STIs)  You should be screened for sexually transmitted infections (STIs) including gonorrhea and chlamydia if:  You are sexually active and are younger than 35 years of age.  You are older than 35 years of age and your health care provider tells you that you are at risk for this type of infection.  Your sexual activity has changed since you were last screened and you are at an increased risk for chlamydia or gonorrhea. Ask your health care provider if you are at risk.  If you do not have HIV, but are at risk, it may be recommended that you take a prescription medicine daily to prevent HIV infection. This is called pre-exposure prophylaxis (PrEP). You are considered at risk if:  You are sexually active and do not regularly use condoms or know the HIV status of your partner(s).  You take drugs by injection.  You are sexually  active with a partner who has HIV. Talk with your health care provider about whether you are at high risk of being infected with HIV. If you choose to begin PrEP, you should first be tested for HIV. You should then be tested every 3 months for as long as you are taking PrEP.  PREGNANCY   If you are premenopausal and you may become pregnant, ask your health care provider about preconception counseling.  If you may  become pregnant, take 400 to 800 micrograms (mcg) of folic acid every day.  If you want to prevent pregnancy, talk to your health care provider about birth control (contraception). OSTEOPOROSIS AND MENOPAUSE   Osteoporosis is a disease in which the bones lose minerals and strength with aging. This can result in serious bone fractures. Your risk for osteoporosis can be identified using a bone density scan.  If you are 61 years of age or older, or if you are at risk for osteoporosis and fractures, ask your health care provider if you should be screened.  Ask your health care provider whether you should take a calcium or vitamin D supplement to lower your risk for osteoporosis.  Menopause may have certain physical symptoms and risks.  Hormone replacement therapy may reduce some of these symptoms and risks. Talk to your health care provider about whether hormone replacement therapy is right for you.  HOME CARE INSTRUCTIONS   Schedule regular health, dental, and eye exams.  Stay current with your immunizations.   Do not use any tobacco products including cigarettes, chewing tobacco, or electronic cigarettes.  If you are pregnant, do not drink alcohol.  If you are breastfeeding, limit how much and how often you drink alcohol.  Limit alcohol intake to no more than 1 drink per day for nonpregnant women. One drink equals 12 ounces of beer, 5 ounces of wine, or 1 ounces of hard liquor.  Do not use street drugs.  Do not share needles.  Ask your health care provider for help if  you need support or information about quitting drugs.  Tell your health care provider if you often feel depressed.  Tell your health care provider if you have ever been abused or do not feel safe at home.   This information is not intended to replace advice given to you by your health care provider. Make sure you discuss any questions you have with your health care provider.   Document Released: 03/19/2011 Document Revised: 09/24/2014 Document Reviewed: 08/05/2013 Elsevier Interactive Patient Education [employer].

## 2015-09-01 NOTE — Progress Notes (Signed)
Angel Casey Jan 01, 1981 [id number]    History:    Presents for annual exam.  Monthly cycle on NuvaRing without complaint. 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 Has had problems with back and neck pain massage therapy has helped. Reports labs at work all normal except elevated glucose. States blood pressure was 130/84 and labeled her as hypertensive. Has started on Adderall the past year for attention deficit which has helped.  Past medical history, past surgical history, family history and social history were all reviewed and documented in the EPIC chart. 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 Works in [employer]ing [employer].  ROS:  A ROS was performed and pertinent positives and negatives are included.  Exam:  Filed Vitals:   09/01/15 1117  BP: 128/80    General appearance:  Normal Thyroid:  Symmetrical, normal in size, without palpable masses or nodularity. Respiratory  Auscultation:  Clear without wheezing or rhonchi Cardiovascular  Auscultation:  Regular rate, without rubs, murmurs or gallops  Edema/varicosities:  Not grossly evident Abdominal  Soft,nontender, without masses, guarding or rebound.  Liver/spleen:  No organomegaly noted  Hernia:  None appreciated  Skin  Inspection:  Grossly normal   Breasts: Examined lying and sitting.     Right: Without masses, retractions, discharge or axillary adenopathy.     Left: Without masses, retractions, discharge or axillary adenopathy. Gentitourinary   Inguinal/mons:  Normal without inguinal adenopathy  External genitalia:  Normal  BUS/Urethra/Skene's glands:  Normal  Vagina:  Normal  Cervix:  Normal  Uterus:  normal in size, shape and contour.  Midline and mobile  Adnexa/parametria:     Rt: Without masses or tenderness.   Lt: Without masses or tenderness.  Anus and perineum: Normal  Digital rectal exam: Normal sphincter tone without palpated masses or tenderness  Assessment/Plan:  34 y.o. S WF G0 for annual exam with no  complaints.  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 GC/Chlamydia, HIV, hep B, C, RPR. SBE's, annual screening mammogram at 40, calcium rich diet, MVI daily encouraged. NuvaRing prescription, proper use, slight risk for blood clots and strokes reviewed. Condoms encouraged if sexually active. Continue healthy lifestyle of regular exercise and healthy diet. Reviewed the slight rise in blood pressure at health screening could have been related to Adderall.  Harrington ChallengerYOUNG,Angel Casey WHNP, 1:02 PM 09/01/2015

## 2015-09-02 LAB — URINALYSIS W MICROSCOPIC + REFLEX CULTURE
BACTERIA UA: NONE SEEN [HPF]
Bilirubin Urine: NEGATIVE
Casts: NONE SEEN [LPF]
Crystals: NONE SEEN [HPF]
GLUCOSE, UA: NEGATIVE
KETONES UR: NEGATIVE
Leukocytes, UA: NEGATIVE
Nitrite: NEGATIVE
PROTEIN: NEGATIVE
RBC / HPF: NONE SEEN RBC/HPF (ref <=2)
SQUAMOUS EPITHELIAL / LPF: NONE SEEN [HPF] (ref <=5)
Specific Gravity, Urine: 1.006 (ref 1.001–1.035)
WBC, UA: NONE SEEN WBC/HPF (ref <=5)
YEAST: NONE SEEN [HPF]
pH: 6 (ref 5.0–8.0)

## 2015-09-02 LAB — RPR

## 2015-09-02 LAB — HIV ANTIBODY (ROUTINE TESTING W REFLEX): HIV 1&2 Ab, 4th Generation: NONREACTIVE

## 2015-09-02 LAB — HEPATITIS C ANTIBODY: HCV Ab: NEGATIVE

## 2015-09-02 LAB — HEMOGLOBIN A1C
Hgb A1c MFr Bld: 5.4 % (ref <5.7)
Mean Plasma Glucose: 108 mg/dL (ref <117)

## 2015-09-02 LAB — HEPATITIS B SURFACE ANTIGEN: HEP B S AG: NEGATIVE

## 2015-09-05 LAB — CYTOLOGY - PAP

## 2015-11-08 ENCOUNTER — Telehealth: Payer: Self-pay | Admitting: *Deleted

## 2015-11-08 MED ORDER — FLUCONAZOLE 150 MG PO TABS: 150.0000 mg | ORAL_TABLET | Freq: Once | ORAL | Status: DC

## 2015-11-08 NOTE — Telephone Encounter (Signed)
(  You are back up MD) Pt called c/o yeast infection itching white discharge, asked if Rx diflucan could be sent to pharmacy? Pt said she is unable to come in office this week due to work and sick relative. Please advise

## 2015-11-08 NOTE — Telephone Encounter (Signed)
Call in prescription Diflucan 150 mg one by mouth if no improvement in next 2-3 day she'll need to come by the office.

## 2015-11-08 NOTE — Telephone Encounter (Signed)
Left detailed message on voicemail and Rx has been sent,.

## 2016-01-06 DIAGNOSIS — F4323 Adjustment disorder with mixed anxiety and depressed mood: Secondary | ICD-10-CM | POA: Diagnosis not present

## 2016-01-11 ENCOUNTER — Encounter: Payer: Self-pay | Admitting: Women's Health

## 2016-01-11 ENCOUNTER — Ambulatory Visit (INDEPENDENT_AMBULATORY_CARE_PROVIDER_SITE_OTHER): Payer: BLUE CROSS/BLUE SHIELD | Admitting: Women's Health

## 2016-01-11 VITALS — BP 126/80

## 2016-01-11 DIAGNOSIS — N63 Unspecified lump in breast: Secondary | ICD-10-CM

## 2016-01-11 DIAGNOSIS — N6310 Unspecified lump in the right breast, unspecified quadrant: Secondary | ICD-10-CM

## 2016-01-11 NOTE — Progress Notes (Signed)
Patient ID: Angel Casey, female   DOB: 08/19/1981, 35 y.o.   MRN: [id number] Presents with complaint of questionable lump in right upper outer quadrant,  noticed area several weeks ago. Slightly tender. No change in routine or injury. No family history of breast cancer. Monthly cycle on NuvaRing. Denies vaginal discharge, urinary symptoms.  Sam: Appears well. Breast exam and sitting and lying position without visible retractions, dimpling or nipple discharge. Right breast fibroglandular, questionable 1 cm mobile nontender mass at 10:00 position. Left breast no palpable nodules.  Rule out breast mass  Plan: Diagnostic mammogram with ultrasound. Continue SBE's, report changes.

## 2016-01-13 ENCOUNTER — Telehealth: Payer: Self-pay | Admitting: *Deleted

## 2016-01-13 DIAGNOSIS — N6310 Unspecified lump in the right breast, unspecified quadrant: Secondary | ICD-10-CM

## 2016-01-13 NOTE — Telephone Encounter (Signed)
Orders placed at breast center they will contact pt to schedule. 

## 2016-01-13 NOTE — Telephone Encounter (Signed)
-----   Message from Harrington ChallengerNancy J Young, NP sent at 01/11/2016 11:46 AM EDT ----- Please sched diag mammogram, after may 2 am best, ?palpable nodule 10:00 1 cm    no family hx of breast ca.

## 2016-01-16 NOTE — Telephone Encounter (Signed)
Appointment 01/18/16 @ 8:30am

## 2016-01-18 ENCOUNTER — Ambulatory Visit
Admission: RE | Admit: 2016-01-18 | Discharge: 2016-01-18 | Disposition: A | Discharge status: Home / Self Care | Payer: BLUE CROSS/BLUE SHIELD | Source: Ambulatory Visit | Attending: Women's Health | Admitting: Women's Health

## 2016-01-18 DIAGNOSIS — N644 Mastodynia: Secondary | ICD-10-CM | POA: Diagnosis not present

## 2016-01-18 DIAGNOSIS — N6310 Unspecified lump in the right breast, unspecified quadrant: Secondary | ICD-10-CM

## 2016-01-18 DIAGNOSIS — R922 Inconclusive mammogram: Secondary | ICD-10-CM | POA: Diagnosis not present

## 2016-01-20 DIAGNOSIS — H93299 Other abnormal auditory perceptions, unspecified ear: Secondary | ICD-10-CM | POA: Diagnosis not present

## 2016-01-20 DIAGNOSIS — Z79899 Other long term (current) drug therapy: Secondary | ICD-10-CM | POA: Diagnosis not present

## 2016-01-20 DIAGNOSIS — F429 Obsessive-compulsive disorder, unspecified: Secondary | ICD-10-CM | POA: Diagnosis not present

## 2016-01-20 DIAGNOSIS — F902 Attention-deficit hyperactivity disorder, combined type: Secondary | ICD-10-CM | POA: Diagnosis not present

## 2016-04-10 DIAGNOSIS — D1800 Hemangioma unspecified site: Secondary | ICD-10-CM | POA: Diagnosis not present

## 2016-04-10 DIAGNOSIS — L821 Other seborrheic keratosis: Secondary | ICD-10-CM | POA: Diagnosis not present

## 2016-04-10 DIAGNOSIS — D225 Melanocytic nevi of trunk: Secondary | ICD-10-CM | POA: Diagnosis not present

## 2016-04-23 DIAGNOSIS — K589 Irritable bowel syndrome without diarrhea: Secondary | ICD-10-CM | POA: Diagnosis not present

## 2016-04-23 DIAGNOSIS — H93299 Other abnormal auditory perceptions, unspecified ear: Secondary | ICD-10-CM | POA: Diagnosis not present

## 2016-04-23 DIAGNOSIS — F902 Attention-deficit hyperactivity disorder, combined type: Secondary | ICD-10-CM | POA: Diagnosis not present

## 2016-04-23 DIAGNOSIS — Z79899 Other long term (current) drug therapy: Secondary | ICD-10-CM | POA: Diagnosis not present

## 2016-06-01 ENCOUNTER — Ambulatory Visit (INDEPENDENT_AMBULATORY_CARE_PROVIDER_SITE_OTHER): Payer: BLUE CROSS/BLUE SHIELD

## 2016-06-01 ENCOUNTER — Ambulatory Visit (INDEPENDENT_AMBULATORY_CARE_PROVIDER_SITE_OTHER): Payer: BLUE CROSS/BLUE SHIELD | Admitting: Podiatry

## 2016-06-01 ENCOUNTER — Encounter: Payer: Self-pay | Admitting: Podiatry

## 2016-06-01 DIAGNOSIS — R52 Pain, unspecified: Secondary | ICD-10-CM

## 2016-06-01 DIAGNOSIS — M779 Enthesopathy, unspecified: Secondary | ICD-10-CM | POA: Diagnosis not present

## 2016-06-01 DIAGNOSIS — M722 Plantar fascial fibromatosis: Secondary | ICD-10-CM

## 2016-06-01 DIAGNOSIS — M2012 Hallux valgus (acquired), left foot: Secondary | ICD-10-CM | POA: Diagnosis not present

## 2016-06-01 MED ORDER — MELOXICAM 15 MG PO TABS: 15.0000 mg | ORAL_TABLET | Freq: Every day | ORAL | 2 refills | Status: DC

## 2016-06-01 NOTE — Progress Notes (Signed)
   Subjective:    Patient ID: Angel Casey, female    DOB: [birth year], 35 y.o.   MRN: [id number]  HPI  35 year old female Presents the office today for concerns of pain on the left foot she points overlying the bunion. She states that she has broken her right foot 2 times previously although she does not have any pain to her right foot today. She states that her left big toe she has some difficulty with trying to bend the toe at times. She does a lot of running shoes have to decrease her activity due to the pain to her left big toe joint. She denies any recent injury or trauma. This started after becoming more active. This has been ongoing the last month.  Review of Systems  All other systems reviewed and are negative.      Objective:   Physical Exam General: AAO x3, NAD  Dermatological: There is faint erythema at the medial aspect left first metatarsal head on the side of the prominent bunion. There is no skin breakdown there is no pre-ulcerative lesions or open sores identified today.  Vascular: Dorsalis Pedis artery and Posterior Tibial artery pedal pulses are 2/4 bilateral with immedate capillary fill time. There is no pain with calf compression, swelling, warmth, erythema.   Neruologic: Grossly intact via light touch bilateral. Vibratory intact via tuning fork bilateral. Protective threshold with Semmes Wienstein monofilament intact to all pedal sites bilateral.   Musculoskeletal: Mild to moderate HAV present left side there is tenderness on the bunion prominence. There is no pain or crepitation resection with first MTPJ range of motion. There is no specific area pinpoint bony tenderness there is no pain vibratory sensation. There is localized edema to the medial aspect of the first metatarsal head and faint amount of erythema likely from irritation as opposed to infection. There is no fluctuance or crepitus there is no malodor. Decrease in medial arch height upon weightbearing.  Subjectively there is tenderness on the medial band plantar fascia the arch of the foot however there is no pain today but she does get this times while working out. MMT 5/5. Range of motion intact.  Gait: Unassisted, Nonantalgic.      Assessment & Plan:  Left foot first MTPJ capsulitis, bunion; plantar fasciitis  -Treatment options discussed including all alternatives, risks, and complications -Etiology of symptoms were discussed -X-rays were obtained and reviewed with the patient. HAV is present. No evidence of acute fracture. -Prescribed mobic. Discussed side effects of the medication and directed to stop if any are to occur and call the office.  -Discussed shoe gear modifications and orthotics. Should proceed with custom inserts. [facility] -Offloading pads for the bunion dispensed. -Limit activity until the symptoms resolved. -Follow-up in 4 weeks or sooner if needed. If symptoms continue discussed steroid injection.  Ovid CurdMatthew Malloree Raboin, DPM

## 2016-06-01 NOTE — Patient Instructions (Signed)

## 2016-06-04 DIAGNOSIS — M2010 Hallux valgus (acquired), unspecified foot: Secondary | ICD-10-CM | POA: Insufficient documentation

## 2016-06-06 ENCOUNTER — Ambulatory Visit: Payer: BLUE CROSS/BLUE SHIELD | Admitting: Podiatry

## 2016-06-22 ENCOUNTER — Encounter: Payer: Self-pay | Admitting: Podiatry

## 2016-06-22 ENCOUNTER — Ambulatory Visit (INDEPENDENT_AMBULATORY_CARE_PROVIDER_SITE_OTHER): Payer: BLUE CROSS/BLUE SHIELD | Admitting: Podiatry

## 2016-06-22 ENCOUNTER — Telehealth: Payer: Self-pay | Admitting: *Deleted

## 2016-06-22 DIAGNOSIS — M779 Enthesopathy, unspecified: Secondary | ICD-10-CM

## 2016-06-22 DIAGNOSIS — M2012 Hallux valgus (acquired), left foot: Secondary | ICD-10-CM | POA: Diagnosis not present

## 2016-06-22 DIAGNOSIS — M722 Plantar fascial fibromatosis: Secondary | ICD-10-CM | POA: Diagnosis not present

## 2016-06-22 NOTE — Progress Notes (Signed)
Subjective: 35 year old female presents the office at a pickup orthotics as well as for recurrence of pain to the bunion site. She was wearing the offloading pads which helped quite a bit but since she stopped wearing that she has noticed that the bunion does become more painful as well as getting inflamed. Her mother just recently had bunion surgery. Denies any recent injury or trauma.  Denies any systemic complaints such as fevers, chills, nausea, vomiting. No acute changes since last appointment, and no other complaints at this time.   Objective: AAO x3, NAD DP/PT pulses palpable bilaterally, CRT less than 3 seconds Moderate HAV is present on the left side and site irritation/erythema on the medial aspect of the first metatarsal head from shoe gear. There is no pain or crepitation first MPJ range of motion. No hypermobility is present. There is mild but improved tenderness on the plantar medial tubercle of the calcaneus at insertion of plantar fascia. There is no pain lateral compression of calcaneus there is no overlying edema, erythema, increase in warmth. No other areas of tenderness bilaterally.  No open lesions or pre-ulcerative lesions.  No pain with calf compression, swelling, warmth, erythema  Assessment: Capsulitis first MPJ/bunion, plantar fasciitis  Plan: -All treatment options discussed with the patient including all alternatives, risks, complications.  -Steroid injection performed today on the first metatarsophalangeal joint with a mixture of dexamethasone and local anesthetic under sterile conditions. Post injection care was discussed. Offloading pads were dispensed today. Orthotics were also dispensed an oral and written break in instructions were discussed. Continue anti-inflammatory, icing, shoe gear modifications. I'll see her back in 4 weeks or sooner patient to arise. -Patient encouraged to call the office with any questions, concerns, change in symptoms.   Ovid CurdMatthew Saliyah Gillin,  DPM

## 2016-06-22 NOTE — Patient Instructions (Signed)

## 2016-06-22 NOTE — Telephone Encounter (Addendum)
Pt states she was seen today, and got a shot, now the foot is in more pain than before she came in, is that normal? Pt states she had 2 business meetings after the appt, and was in athletic shoes. I told pt most people went home to rest the area, so this may be normal for someone active right after the injection. I told pt to rest ice, and I would call back. Dr. Ardelle AntonWagoner states it is not unusual due to a little bit more fluid put in a little space and then activity, also could be a steroid flare, should rest, ice and take the Meloxicam, if tolerates also may take OTC tylenol as package directs. Pt states understanding.

## 2016-07-20 ENCOUNTER — Encounter: Payer: Self-pay | Admitting: Podiatry

## 2016-07-20 ENCOUNTER — Ambulatory Visit (INDEPENDENT_AMBULATORY_CARE_PROVIDER_SITE_OTHER): Payer: BLUE CROSS/BLUE SHIELD | Admitting: Podiatry

## 2016-07-20 DIAGNOSIS — M779 Enthesopathy, unspecified: Secondary | ICD-10-CM | POA: Diagnosis not present

## 2016-07-20 DIAGNOSIS — M2012 Hallux valgus (acquired), left foot: Secondary | ICD-10-CM | POA: Diagnosis not present

## 2016-07-20 DIAGNOSIS — M722 Plantar fascial fibromatosis: Secondary | ICD-10-CM

## 2016-07-20 NOTE — Patient Instructions (Signed)

## 2016-07-21 NOTE — Progress Notes (Signed)
Subjective: 35 year old female presents the office they for follow-up evaluation of left foot pain. She states that she had quite a bit of discomfort after the injection however her pain has improved at this point compared to what it was before the injection. She states that she still has difficulty with certain shoes on the left foot. Overall she is improving to start him problems. She did start with and the orthotics that she states of the right heel is hurting her in the insert. Denies any systemic complaints such as fevers, chills, nausea, vomiting. No acute changes since last appointment, and no other complaints at this time.   Objective: AAO x3, NAD DP/PT pulses palpable bilaterally, CRT less than 3 seconds Moderate HAV is present on the left foot and there is tenderness along both the medial aspect of the first metatarsal head as well as plantarly. There is no significant edema, erythema, increase in warmth at this time. There is, no tenderness the right heel. There is no other areas of tenderness bilaterally. No open lesions or pre-ulcerative lesions.  No pain with calf compression, swelling, warmth, erythema  Assessment: Left HAV, right plantar fasciitis  Plan: -All treatment options discussed with the patient including all alternatives, risks, complications.  -Capsulitis of the left first MTPJ appears to be improving, she still has a structural bunion deformity. I discussed with her treatment options included surgical and conservative treatment. She was to continue with conservative treatment. The orthotics to help her however they are not fitting well the right foot. I'm sending orthotics back for modifications. Also to add a metatarsal pad and the left foot. As discussed shoe gear modifications. If symptoms continue discuss surgical intervention. Into the offloading pads. -Patient encouraged to call the office with any questions, concerns, change in symptoms.   Ovid CurdMatthew Wagoner,  DPM

## 2016-07-23 DIAGNOSIS — F902 Attention-deficit hyperactivity disorder, combined type: Secondary | ICD-10-CM | POA: Diagnosis not present

## 2016-07-23 DIAGNOSIS — H93299 Other abnormal auditory perceptions, unspecified ear: Secondary | ICD-10-CM | POA: Diagnosis not present

## 2016-07-23 DIAGNOSIS — F429 Obsessive-compulsive disorder, unspecified: Secondary | ICD-10-CM | POA: Diagnosis not present

## 2016-07-23 DIAGNOSIS — Z79899 Other long term (current) drug therapy: Secondary | ICD-10-CM | POA: Diagnosis not present

## 2016-07-25 DIAGNOSIS — F4323 Adjustment disorder with mixed anxiety and depressed mood: Secondary | ICD-10-CM | POA: Diagnosis not present

## 2016-08-06 ENCOUNTER — Ambulatory Visit: Payer: BLUE CROSS/BLUE SHIELD | Admitting: Podiatry

## 2016-08-08 DIAGNOSIS — F4323 Adjustment disorder with mixed anxiety and depressed mood: Secondary | ICD-10-CM | POA: Diagnosis not present

## 2016-08-13 ENCOUNTER — Ambulatory Visit (INDEPENDENT_AMBULATORY_CARE_PROVIDER_SITE_OTHER): Payer: BLUE CROSS/BLUE SHIELD | Admitting: Podiatry

## 2016-08-13 ENCOUNTER — Encounter: Payer: Self-pay | Admitting: Podiatry

## 2016-08-13 DIAGNOSIS — M722 Plantar fascial fibromatosis: Secondary | ICD-10-CM

## 2016-08-14 NOTE — Progress Notes (Signed)
Patient presents to the office today to PUO however the metatarsal pad is too big. I have sent the orthotics back to remove this. She states that overall she is doing much better. She is an occasional discomfort to the left foot however this is very intermittent and mild. She is able to the gym daily activities or any discomfort.

## 2016-09-04 ENCOUNTER — Ambulatory Visit (INDEPENDENT_AMBULATORY_CARE_PROVIDER_SITE_OTHER): Payer: BLUE CROSS/BLUE SHIELD | Admitting: Women's Health

## 2016-09-04 ENCOUNTER — Encounter: Payer: Self-pay | Admitting: Women's Health

## 2016-09-04 ENCOUNTER — Other Ambulatory Visit: Payer: Self-pay | Admitting: Women's Health

## 2016-09-04 VITALS — BP 118/80 | Ht 65.0 in | Wt 147.0 lb

## 2016-09-04 DIAGNOSIS — R7309 Other abnormal glucose: Secondary | ICD-10-CM | POA: Diagnosis not present

## 2016-09-04 DIAGNOSIS — Z01419 Encounter for gynecological examination (general) (routine) without abnormal findings: Secondary | ICD-10-CM | POA: Diagnosis not present

## 2016-09-04 DIAGNOSIS — Z3044 Encounter for surveillance of vaginal ring hormonal contraceptive device: Secondary | ICD-10-CM

## 2016-09-04 DIAGNOSIS — Z13220 Encounter for screening for lipoid disorders: Secondary | ICD-10-CM

## 2016-09-04 LAB — LIPID PANEL
CHOLESTEROL: 171 mg/dL (ref <200)
HDL: 74 mg/dL (ref >50)
LDL Cholesterol: 80 mg/dL (ref <100)
Total CHOL/HDL Ratio: 2.3 Ratio (ref <5.0)
Triglycerides: 83 mg/dL (ref <150)
VLDL: 17 mg/dL (ref <30)

## 2016-09-04 LAB — CBC WITH DIFFERENTIAL/PLATELET
BASOS ABS: 0 {cells}/uL (ref 0–200)
Basophils Relative: 0 %
EOS PCT: 1 %
Eosinophils Absolute: 77 cells/uL (ref 15–500)
HCT: 38.7 % (ref 35.0–45.0)
Hemoglobin: 12.6 g/dL (ref 11.7–15.5)
LYMPHS PCT: 35 %
Lymphs Abs: 2695 cells/uL (ref 850–3900)
MCH: 30.7 pg (ref 27.0–33.0)
MCHC: 32.6 g/dL (ref 32.0–36.0)
MCV: 94.4 fL (ref 80.0–100.0)
MONOS PCT: 6 %
MPV: 10.5 fL (ref 7.5–12.5)
Monocytes Absolute: 462 cells/uL (ref 200–950)
NEUTROS PCT: 58 %
Neutro Abs: 4466 cells/uL (ref 1500–7800)
PLATELETS: 329 10*3/uL (ref 140–400)
RBC: 4.1 MIL/uL (ref 3.80–5.10)
RDW: 12.7 % (ref 11.0–15.0)
WBC: 7.7 10*3/uL (ref 3.8–10.8)

## 2016-09-04 LAB — GLUCOSE, RANDOM: GLUCOSE: 113 mg/dL — AB (ref 65–99)

## 2016-09-04 MED ORDER — ETONOGESTREL-ETHINYL ESTRADIOL 0.12-0.015 MG/24HR VA RING: VAGINAL_RING | VAGINAL | 4 refills | Status: DC

## 2016-09-04 NOTE — Progress Notes (Signed)
Angel Casey 11-Aug-1981 [id number]    History:    Presents for annual exam.  Monthly cycle on NuvaRing without complaint. Not sexually active greater than one year. 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  Past medical history, past surgical history, family history and social history were all reviewed and documented in the EPIC chart. 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 Mother healthy.   ROS:  A ROS was performed and pertinent positives and negatives are included.  Exam:  Vitals:   09/04/16 1134  BP: 118/80  Weight: 147 lb (66.7 kg)  Height: 5\' 5"  (1.651 m)   Body mass index is 24.46 kg/m.   General appearance:  Normal Thyroid:  Symmetrical, normal in size, without palpable masses or nodularity. Respiratory  Auscultation:  Clear without wheezing or rhonchi Cardiovascular  Auscultation:  Regular rate, without rubs, murmurs or gallops  Edema/varicosities:  Not grossly evident Abdominal  Soft,nontender, without masses, guarding or rebound.  Liver/spleen:  No organomegaly noted  Hernia:  None appreciated  Skin  Inspection:  Grossly normal   Breasts: Examined lying and sitting.     Right: Without masses, retractions, discharge or axillary adenopathy.     Left: Without masses, retractions, discharge or axillary adenopathy. Gentitourinary   Inguinal/mons:  Normal without inguinal adenopathy  External genitalia:  Normal  BUS/Urethra/Skene's glands:  Normal  Vagina:  Normal  Cervix:  Normal  Uterus:   normal in size, shape and contour.  Midline and mobile  Adnexa/parametria:     Rt: Without masses or tenderness.   Lt: Without masses or tenderness.  Anus and perineum: Normal  Digital rectal exam: Normal sphincter tone without palpated masses or tenderness  Assessment/Plan:  35 y.o. S WF G0 for annual exam with back and neck pain/chiropractor managing.  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 NuvaRing prescription, proper use, slight risk for blood clots and strokes reviewed. Condoms encouraged if sexually active. CBC, glucose, lipid panel, vitamin D, UA, Pap normal with negative HR HPV 2016, new screening guidelines reviewed.    Angel Casey, 12:15 PM 09/04/2016

## 2016-09-04 NOTE — Patient Instructions (Signed)

## 2016-09-05 LAB — URINALYSIS W MICROSCOPIC + REFLEX CULTURE
Bacteria, UA: NONE SEEN [HPF]
Bilirubin Urine: NEGATIVE
CASTS: NONE SEEN [LPF]
Crystals: NONE SEEN [HPF]
GLUCOSE, UA: NEGATIVE
Hgb urine dipstick: NEGATIVE
KETONES UR: NEGATIVE
LEUKOCYTES UA: NEGATIVE
NITRITE: NEGATIVE
PH: 5 (ref 5.0–8.0)
Protein, ur: NEGATIVE
RBC / HPF: NONE SEEN RBC/HPF (ref <=2)
SPECIFIC GRAVITY, URINE: 1.013 (ref 1.001–1.035)
Squamous Epithelial / LPF: NONE SEEN [HPF] (ref <=5)
WBC UA: NONE SEEN WBC/HPF (ref <=5)
Yeast: NONE SEEN [HPF]

## 2016-09-05 LAB — VITAMIN D 25 HYDROXY (VIT D DEFICIENCY, FRACTURES): Vit D, 25-Hydroxy: 37 ng/mL (ref 30–100)

## 2016-09-06 ENCOUNTER — Other Ambulatory Visit: Payer: Self-pay | Admitting: Women's Health

## 2016-09-06 DIAGNOSIS — Z3040 Encounter for surveillance of contraceptives, unspecified: Secondary | ICD-10-CM

## 2016-09-06 LAB — HEMOGLOBIN A1C
Hgb A1c MFr Bld: 5.1 % (ref <5.7)
MEAN PLASMA GLUCOSE: 100 mg/dL

## 2016-09-24 ENCOUNTER — Ambulatory Visit: Payer: BLUE CROSS/BLUE SHIELD

## 2016-10-23 DIAGNOSIS — F429 Obsessive-compulsive disorder, unspecified: Secondary | ICD-10-CM | POA: Diagnosis not present

## 2016-10-23 DIAGNOSIS — F902 Attention-deficit hyperactivity disorder, combined type: Secondary | ICD-10-CM | POA: Diagnosis not present

## 2016-10-23 DIAGNOSIS — H93299 Other abnormal auditory perceptions, unspecified ear: Secondary | ICD-10-CM | POA: Diagnosis not present

## 2016-10-23 DIAGNOSIS — Z79899 Other long term (current) drug therapy: Secondary | ICD-10-CM | POA: Diagnosis not present

## 2016-10-25 DIAGNOSIS — L700 Acne vulgaris: Secondary | ICD-10-CM | POA: Diagnosis not present

## 2016-11-05 ENCOUNTER — Ambulatory Visit: Payer: BLUE CROSS/BLUE SHIELD | Admitting: Podiatry

## 2016-11-08 ENCOUNTER — Other Ambulatory Visit: Payer: BLUE CROSS/BLUE SHIELD | Admitting: *Deleted

## 2016-11-21 ENCOUNTER — Telehealth: Payer: Self-pay | Admitting: *Deleted

## 2016-11-21 NOTE — Telephone Encounter (Signed)
Pt called has started a new Keto diet and has noticed that her period starts 1 week early, still using nuvaring 0.12-0.015 mg as directed. Pt has been on diet for 45 days, cycles now 1 week long., and this is the second time period has been early. Pt would like to know you thoughts about this. Please advise

## 2016-11-21 NOTE — Telephone Encounter (Signed)
Telephone call, has not been sexually active in months. Will get this time if cycle comes early next month instructed to take NuvaRing out and replaced in 5 days to see if we can at cycle matching placebo week. Denies any spotting between cycles or any other symptoms.

## 2016-11-29 ENCOUNTER — Ambulatory Visit (INDEPENDENT_AMBULATORY_CARE_PROVIDER_SITE_OTHER): Payer: BLUE CROSS/BLUE SHIELD | Admitting: Podiatry

## 2016-11-29 ENCOUNTER — Encounter: Payer: Self-pay | Admitting: Podiatry

## 2016-11-29 DIAGNOSIS — L02611 Cutaneous abscess of right foot: Secondary | ICD-10-CM

## 2016-11-29 DIAGNOSIS — L600 Ingrowing nail: Secondary | ICD-10-CM | POA: Diagnosis not present

## 2016-11-29 DIAGNOSIS — L03031 Cellulitis of right toe: Secondary | ICD-10-CM | POA: Diagnosis not present

## 2016-11-29 DIAGNOSIS — M779 Enthesopathy, unspecified: Secondary | ICD-10-CM

## 2016-11-29 DIAGNOSIS — M722 Plantar fascial fibromatosis: Secondary | ICD-10-CM | POA: Diagnosis not present

## 2016-11-29 MED ORDER — CEPHALEXIN 500 MG PO CAPS: 500.0000 mg | ORAL_CAPSULE | Freq: Three times a day (TID) | ORAL | 2 refills | Status: DC

## 2016-11-29 NOTE — Patient Instructions (Signed)

## 2016-11-29 NOTE — Progress Notes (Signed)
Patient ID: Angel Casey, female   DOB: 05-15-1981, 36 y.o.   MRN: [id number]  Subjective: 36 year old female presents the office if the cup orthotics. Also washes orthotics today she requested to be seen by myself she has had some redness to her toes was debrided with a right third toe which started after getting a pedicure. His been ongoing for about 1 month. She denies any drainage or pus that she is an occasional discomfort. Just a fifth toes of been read some and is intermittent. She states of the heels to hurts some and should have an injection however not today. The bunion pain is resolved. Denies any systemic complaints such as fevers, chills, nausea, vomiting. No acute changes since last appointment, and no other complaints at this time.   Objective: AAO x3, NAD DP/PT pulses palpable bilaterally, CRT less than 3 seconds Mild to moderate HAV is present bilaterally. No pain on the bunion since then. There is mild discomfort on the plantar medial tubercle of the calcaneus at the insertion the plantar fascial bilaterally. There is no pain lateral compression of calcaneus. There appears to be mild localized edema and erythema to the lateral aspect of the right third digit toenail. There is no drainage or pus expressed. There is mild incurvation of the nail. Adductovarus is present bilateral fifth digits No open lesions or pre-ulcerative lesions.  No pain with calf compression, swelling, warmth, erythema  Assessment: Plantar fasciitis, resolved bunion pain; ingrown toenail with localized infection right third toe  Plan: -All treatment options discussed with the patient including all alternatives, risks, complications.  -Orthotics were dispensed today. Oral and written break in instructions were discussed. Discussed possible heel lifts given her limited length discrepancy however we will try the orthotics first to see how she does. She does have a heel lift already as well as she can use. -She  must have an injection however she will schedule this at her next appointment she does not want due to day. Continue stretching, icing exercises daily. -Given the localized infection the right third toes start Keflex which was prescribed today. Monitor for signs or symptoms of worsening infection. A consult soaks daily. Symptoms continue she may need to have a partial nail avulsion. -Patient encouraged to call the office with any questions, concerns, change in symptoms.   Ovid CurdMatthew Wagoner, DPM

## 2016-12-26 ENCOUNTER — Other Ambulatory Visit: Payer: Self-pay | Admitting: Women's Health

## 2016-12-26 ENCOUNTER — Telehealth: Payer: Self-pay

## 2016-12-26 MED ORDER — FLUCONAZOLE 150 MG PO TABS: 150.0000 mg | ORAL_TABLET | Freq: Once | ORAL | 1 refills | Status: AC

## 2016-12-26 NOTE — Telephone Encounter (Signed)
Just finished antibiotic for infection in toe. Now with vaginal yeast infection symptoms and is requesting a Diflucan tablet.

## 2016-12-26 NOTE — Telephone Encounter (Signed)
Rx sent 

## 2016-12-26 NOTE — Telephone Encounter (Signed)
Okay for Diflucan 150 mg  times one dose with 1 refill.

## 2016-12-27 ENCOUNTER — Ambulatory Visit (INDEPENDENT_AMBULATORY_CARE_PROVIDER_SITE_OTHER): Payer: BLUE CROSS/BLUE SHIELD | Admitting: Podiatry

## 2016-12-27 DIAGNOSIS — L819 Disorder of pigmentation, unspecified: Secondary | ICD-10-CM | POA: Diagnosis not present

## 2016-12-27 DIAGNOSIS — M722 Plantar fascial fibromatosis: Secondary | ICD-10-CM

## 2016-12-27 DIAGNOSIS — L600 Ingrowing nail: Secondary | ICD-10-CM | POA: Diagnosis not present

## 2016-12-27 MED ORDER — MELOXICAM 15 MG PO TABS: 15.0000 mg | ORAL_TABLET | Freq: Every day | ORAL | 2 refills | Status: DC

## 2016-12-27 NOTE — Patient Instructions (Signed)

## 2016-12-28 ENCOUNTER — Telehealth: Payer: Self-pay | Admitting: *Deleted

## 2016-12-28 DIAGNOSIS — L819 Disorder of pigmentation, unspecified: Secondary | ICD-10-CM

## 2016-12-28 NOTE — Progress Notes (Signed)
Subjective: 36 year old female presents the office today for multiple concerns. She states that the bottom of her right heel is still hurting and she is inquiring about possible injection if needed. She has not been stretching or icing. She has been trying the orthotics however there still not fitting correctly. Also she states that her right third toe still has a purple discoloration to the redness has resolved. She also states that her other toes in the morning she gets up are purple. She has no history of Raynaud's or any other symptoms. Denies any systemic complaints such as fevers, chills, nausea, vomiting. No acute changes since last appointment, and no other complaints at this time.   Objective: AAO x3, NAD DP/PT pulses palpable bilaterally, CRT less than 3 seconds Tenderness to palpation along the plantar medial tubercle of the calcaneus at the insertion of plantar fascia on the right foot. There is no pain along the course of the plantar fascia within the arch of the foot. Plantar fascia appears to be intact. There is no pain with lateral compression of the calcaneus or pain with vibratory sensation. There is no pain along the course or insertion of the achilles tendon. No other areas of tenderness to bilateral lower extremities. Slight purple discoloration to the lateral aspect of the right third toe. There is minimal incurvation along the nail and there is no drainage or pus. There is no edema. There is no other discoloration the other digits today however subjectively she states that her other toes are purple when she gets up most of her fifth toes. No open lesions or pre-ulcerative lesions.  No pain with calf compression, swelling, warmth, erythema  Assessment: Toe discoloration; ingrown toenail; right heel pain/plantar fasciitis  Plan: -All treatment options discussed with the patient including all alternatives, risks, complications.  -Discussed the partial nail avulsion right third  toe. She wishes to hold off on this today. Recommend continue Epsom salts soaks. He has formed to the area as well. The erythema has resolved and there is no other signs of infection. -She still concerned with a purple discoloration to her toes. I will or arterial studies to include toe pressures to evaluate circulation. Good be possible Raynaud's however her symptoms don't seem to fit with this. -Patient elects to proceed with steroid injection into the right heel. Under sterile skin preparation, a total of 2.5cc of kenalog 10, 0.5% Marcaine plain, and 2% lidocaine plain were infiltrated into the symptomatic area without complication. A band-aid was applied. Patient tolerated the injection well without complication. Post-injection care with discussed with the patient. Discussed with the patient to ice the area over the next couple of days to help prevent a steroid flare.  Continue stretching, icing exercises daily. Also had Rick, evaluate her orthotics and they were modified. Also discussed the change in shoes. -Follow-up 3 weeks or sooner if needed. Call any questions or concerns any time.  Ovid Curd, DPM   -Patient encouraged to call the office with any questions, concerns, change in symptoms.

## 2016-12-28 NOTE — Telephone Encounter (Addendum)
-----   Message from Vivi Barrack, DPM sent at 12/27/2016  4:16 PM EDT ----- Can you please order arterial studies (include toe pressures); purple toes possible raynauds. 12/28/2016-Orders faxed to [email or web address].02/[email or web address]-Dr. Ardelle Anton states dopplers 02/07/2017 are abnormal especially the toes and would like to refer to Rheumatology. Left message for pt to call for results.

## 2016-12-31 ENCOUNTER — Other Ambulatory Visit: Payer: BLUE CROSS/BLUE SHIELD

## 2017-01-10 DIAGNOSIS — F4323 Adjustment disorder with mixed anxiety and depressed mood: Secondary | ICD-10-CM | POA: Diagnosis not present

## 2017-01-14 DIAGNOSIS — J029 Acute pharyngitis, unspecified: Secondary | ICD-10-CM | POA: Diagnosis not present

## 2017-01-14 DIAGNOSIS — Z9289 Personal history of other medical treatment: Secondary | ICD-10-CM | POA: Diagnosis not present

## 2017-01-14 DIAGNOSIS — J018 Other acute sinusitis: Secondary | ICD-10-CM | POA: Diagnosis not present

## 2017-01-15 ENCOUNTER — Other Ambulatory Visit: Payer: BLUE CROSS/BLUE SHIELD

## 2017-01-18 ENCOUNTER — Ambulatory Visit (INDEPENDENT_AMBULATORY_CARE_PROVIDER_SITE_OTHER): Payer: BLUE CROSS/BLUE SHIELD | Admitting: Podiatry

## 2017-01-18 ENCOUNTER — Encounter: Payer: Self-pay | Admitting: Podiatry

## 2017-01-18 DIAGNOSIS — L819 Disorder of pigmentation, unspecified: Secondary | ICD-10-CM | POA: Diagnosis not present

## 2017-01-18 DIAGNOSIS — M722 Plantar fascial fibromatosis: Secondary | ICD-10-CM

## 2017-01-18 DIAGNOSIS — M2012 Hallux valgus (acquired), left foot: Secondary | ICD-10-CM | POA: Diagnosis not present

## 2017-01-21 DIAGNOSIS — Z79899 Other long term (current) drug therapy: Secondary | ICD-10-CM | POA: Diagnosis not present

## 2017-01-21 DIAGNOSIS — H93299 Other abnormal auditory perceptions, unspecified ear: Secondary | ICD-10-CM | POA: Diagnosis not present

## 2017-01-21 DIAGNOSIS — F902 Attention-deficit hyperactivity disorder, combined type: Secondary | ICD-10-CM | POA: Diagnosis not present

## 2017-01-21 NOTE — Progress Notes (Signed)
Subjective: 36 year old female presents the office they for follow-up evaluation of plantar fasciitis. She states the plantar fasciitis, heel pain is doing much better. She is having no pain in the heel today. She has been stretching icing which is possible. She started some minimal discomfort recurring to her bunion but this is not significant. She's not yet had her vascular studies performed that she has not heard got the appointment. Denies any systemic complaints such as fevers, chills, nausea, vomiting. No acute changes since last appointment, and no other complaints at this time.   Objective: AAO x3, NAD DP/PT pulses palpable bilaterally, CRT less than 3 seconds Today I am able to appreciate more the purple discoloration to the right third toe as well as bilateral fifth digits. There is no red discoloration to the toes. There is no pain to the toenail is no drainage or pus and is no clinical signs of infection. There is no tenderness along the course or insertion the plantar fascia and the right foot. This appears to be an resolved. Mild to moderate HAV present on the left foot there is no snapping tenderness but she is starting subjectively there is tenderness with shoes and pressure. No other areas of tenderness identified bilaterally. No open lesions or pre-ulcerative lesions. There is no pain with calf compression, swelling, warmth, erythema.      Assessment: Discoloration of toes; resolved plantar fasciitis ; left bunion   Plan: -Treatment options discussed including all alternatives, risks, and complications -Etiology of symptoms were discussed -I gave her the number of the lab to schedule the arterial studies. I am able to appreciate more of the purple discoloration to he toes. See picture above. When she first came in it was more significant.  -Continue stretching, rehabilitation exercises for plantar fasciitis. -Continue offloading pads to the bunion. -RTC after vastus today.  She does not get this the next week or 2 to call the office or if she has any issues trying to schedule an appointment.   Ovid CurdMatthew Wagoner, DPM

## 2017-01-30 ENCOUNTER — Encounter: Payer: Self-pay | Admitting: Gynecology

## 2017-02-06 ENCOUNTER — Ambulatory Visit (HOSPITAL_COMMUNITY)
Admission: RE | Admit: 2017-02-06 | Discharge: 2017-02-06 | Disposition: A | Discharge status: Home / Self Care | Payer: BLUE CROSS/BLUE SHIELD | Source: Ambulatory Visit | Attending: Cardiology | Admitting: Cardiology

## 2017-02-06 ENCOUNTER — Encounter (HOSPITAL_COMMUNITY): Payer: BLUE CROSS/BLUE SHIELD

## 2017-02-06 DIAGNOSIS — L819 Disorder of pigmentation, unspecified: Secondary | ICD-10-CM | POA: Diagnosis not present

## 2017-02-06 DIAGNOSIS — Z87898 Personal history of other specified conditions: Secondary | ICD-10-CM | POA: Diagnosis not present

## 2017-02-06 DIAGNOSIS — R200 Anesthesia of skin: Secondary | ICD-10-CM | POA: Diagnosis not present

## 2017-02-06 DIAGNOSIS — R202 Paresthesia of skin: Secondary | ICD-10-CM | POA: Insufficient documentation

## 2017-02-07 DIAGNOSIS — F4323 Adjustment disorder with mixed anxiety and depressed mood: Secondary | ICD-10-CM | POA: Diagnosis not present

## 2017-02-13 ENCOUNTER — Telehealth: Payer: Self-pay | Admitting: *Deleted

## 2017-02-13 DIAGNOSIS — L819 Disorder of pigmentation, unspecified: Secondary | ICD-10-CM

## 2017-02-13 NOTE — Telephone Encounter (Addendum)
-----   Message from Vivi BarrackMatthew R Wagoner, DPM sent at 02/12/2017 11:54 AM EDT ----- Lets refer to vascular- if they don't want to see her then we will try rheumatology. Thanks! ----- Message ----- From: Marissa Nestle'Connell, Amylee Lodato D, RN Sent: 02/12/2017  11:00 AM To: Vivi BarrackMatthew R Wagoner, DPM  Dr. Ardelle AntonWagoner, I read a little more about Raynards and I think if pt is having more joint or inflammatory type symptoms we should refer to rheumatologist, but if you feel her symptoms are more vascular (I did see the blue toe) I can refer her there. Please advise. Angel Casey.02/13/2017-Left message requesting pt call me for results of circulation test and instructions. Pt called and I informed of Dr. Gabriel RungWagoner's review of results and [facility]Faxed referral, clinicals and demographics to [facility].02/22/2017-Left message encouraging pt to make an appt with [facility] [phone]/26/2017-Pt returned call and asked if it was so emergent that she reschedule her day tomorrow. I told pt that she had called concerning that 03/08/2017 may be too long, and Dr. Ardelle AntonWagoner had replied it had been going on for a while but if you were concerned or if symptoms had worsened to get earlier appt. Pt states there had been times that her toes were black, but they weren't so bad now. I told pt that sounded like spasms of the blood vessels in her toes and should be evaluated. 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 Hazle CocaBerry's appt.

## 2017-02-15 DIAGNOSIS — D225 Melanocytic nevi of trunk: Secondary | ICD-10-CM | POA: Diagnosis not present

## 2017-02-15 DIAGNOSIS — L814 Other melanin hyperpigmentation: Secondary | ICD-10-CM | POA: Diagnosis not present

## 2017-02-15 DIAGNOSIS — L821 Other seborrheic keratosis: Secondary | ICD-10-CM | POA: Diagnosis not present

## 2017-02-15 DIAGNOSIS — D1800 Hemangioma unspecified site: Secondary | ICD-10-CM | POA: Diagnosis not present

## 2017-02-22 NOTE — Telephone Encounter (Signed)
-----   Message from Vivi BarrackMatthew R Wagoner, DPM sent at 02/21/2017  6:23 PM EDT ----- I am just going through things and noticed we put in for a [facility]VS consult but not one is scheduled. Can we follow-up on that? Thanks.

## 2017-02-25 ENCOUNTER — Telehealth: Payer: Self-pay | Admitting: Podiatry

## 2017-02-25 ENCOUNTER — Telehealth: Payer: Self-pay | Admitting: Cardiovascular Disease

## 2017-02-25 DIAGNOSIS — F4323 Adjustment disorder with mixed anxiety and depressed mood: Secondary | ICD-10-CM | POA: Diagnosis not present

## 2017-02-25 NOTE — Telephone Encounter (Signed)
We can try to get her in sooner. This is something that has been ongoing for some time. I think the 22nd would be OK. If there is any worsening to call the office.

## 2017-02-25 NOTE — Telephone Encounter (Signed)
Pt. Is scheduled for an appt with Dr. Allyson SabalBerry on June 22 and she was just wondering if that was too long to wait. She said she received a  Call that kind of scared her, so she wanted to speak with you.

## 2017-02-25 NOTE — Telephone Encounter (Signed)
02/25/2017 [facility] with Dr. Allyson SabalBerry on 6/22/218 at 11:20 am.  [facility]  cbr

## 2017-02-26 NOTE — Telephone Encounter (Addendum)
I called [facility] and had to hang up due to another urgent pt call. [facility] states pt can come in tomorrow 2:00pm for 2:30pm appt with Dr. Allyson SabalBerry. I left 2 messages for pt with the appt date and time, and told her I would leave a message for her mtr, to have her call me due to the importance of getting appt to pt. Left message with pt's mtr, Darel HongJudy to have pt call.

## 2017-02-27 ENCOUNTER — Ambulatory Visit (INDEPENDENT_AMBULATORY_CARE_PROVIDER_SITE_OTHER): Payer: BLUE CROSS/BLUE SHIELD | Admitting: Cardiovascular Disease

## 2017-02-27 ENCOUNTER — Encounter: Payer: Self-pay | Admitting: Cardiovascular Disease

## 2017-02-27 VITALS — BP 130/78 | HR 90 | Ht 65.0 in | Wt 136.8 lb

## 2017-02-27 DIAGNOSIS — I7300 Raynaud's syndrome without gangrene: Secondary | ICD-10-CM | POA: Insufficient documentation

## 2017-02-27 MED ORDER — AMLODIPINE BESYLATE 2.5 MG PO TABS: 2.5000 mg | ORAL_TABLET | Freq: Every day | ORAL | 1 refills | Status: DC

## 2017-02-27 NOTE — Patient Instructions (Signed)
Medication Instructions: Your physician recommends that you continue on your current medications as directed. Please refer to the Current Medication list given to you today.  START Amlodipine 2.5 mg daily.  Labwork: Your physician recommends that you return for lab work: Sedimentation Rate   Follow-Up: Your physician recommends that you schedule a follow-up appointment in: 3 months with Dr. Allyson SabalBerry.  If you need a refill on your cardiac medications before your next appointment, please call your pharmacy.

## 2017-02-27 NOTE — Progress Notes (Signed)
02/27/2017 Reeves ForthByrni C Szafran   09/05/1981  [id number]  Primary Physician Patient, No Pcp Per Primary Cardiologist: Runell GessJonathan J Lexxie Winberg MD Roseanne RenoFACP, FACC, FAHA, FSCAI  HPI:  Ms. Secundino GingerBleiberg is a delightful 36 year old thin and fit appearing single Caucasian female no children and works as a Veterinary surgeonrealtor here in town. She was referred by Dr. Ardelle AntonWagoner, her podiatrist, for evaluation of potential Raynaud's phenomenon. Her only risk factor is minimal tobacco abuse. She does have family history of heart disease with father had a myocardial infarction a 2956 bypass surgery. She's never had a heart attack or stroke. She did not denies chest pain or shortness of breath. She is very active and exercises on a routine basis. She does take Adderall for ADHD. She sees Dr. Ardelle AntonWagoner for plantar fasciitis and bunion but also noted purplish discoloration of her toes which was intermittent. Does not necessarily occur in the colder weather. Recent Dopplers office performed 02/07/17 revealed normal ABIs and TB is with blunted PPG suggesting small vessel disease.   Current Outpatient Prescriptions  Medication Sig Dispense Refill  . amLODipine (NORVASC) 2.5 MG tablet Take 1 tablet (2.5 mg total) by mouth daily. 90 tablet 1  . amphetamine-dextroamphetamine (ADDERALL XR) 30 MG 24 hr capsule Take 30 mg by mouth daily.  0  . etonogestrel-ethinyl estradiol (NUVARING) 0.12-0.015 MG/24HR vaginal ring Insert vaginally and leave in place for 3 consecutive weeks, then remove for 1 week. 3 each 4  . meloxicam (MOBIC) 15 MG tablet Take 1 tablet (15 mg total) by mouth daily. 30 tablet 2  . Probiotic Product (ALIGN PO) Take 1 tablet by mouth daily.      No current facility-administered medications for this visit.     No Known Allergies  Social History   Social History  . Marital status: Single    Spouse name: N/A  . Number of children: N/A  . Years of education: N/A   Occupational History  . Not on file.   Social History Main Topics    . Smoking status: Former Smoker    Packs/day: 0.30    Types: Cigarettes  . Smokeless tobacco: Never Used  . Alcohol use 3.5 oz/week    7 drink(s) per week  . Drug use: No  . Sexual activity: No     Comment: Nuva ring   Other Topics Concern  . Not on file   Social History Narrative  . No narrative on file     Review of Systems: General: negative for chills, fever, night sweats or weight changes.  Cardiovascular: negative for chest pain, dyspnea on exertion, edema, orthopnea, palpitations, paroxysmal nocturnal dyspnea or shortness of breath Dermatological: negative for rash Respiratory: negative for cough or wheezing Urologic: negative for hematuria Abdominal: negative for nausea, vomiting, diarrhea, bright red blood per rectum, melena, or hematemesis Neurologic: negative for visual changes, syncope, or dizziness All other systems reviewed and are otherwise negative except as noted above.    Blood pressure 130/78, pulse 90, height 5\' 5"  (1.651 m), weight 136 lb 12.8 oz (62.1 kg).  General appearance: alert and no distress Neck: no adenopathy, no carotid bruit, no JVD, supple, symmetrical, trachea midline and thyroid not enlarged, symmetric, no tenderness/mass/nodules Lungs: clear to auscultation bilaterally Heart: regular rate and rhythm, S1, S2 normal, no murmur, click, rub or gallop Extremities: extremities normal, atraumatic, no cyanosis or edema and 2+ pedal pulses  EKG sinus rhythm at 90 without ST or T-wave changes. I personally reviewed this EKG  ASSESSMENT AND PLAN:  Raynaud's phenomenon Ms. Fuente was referred to me by Dr. Ardelle Anton for purplish discoloration of her toes. She has no cardiac risk factors other than smoking. Her father did have an myocardial infarction and bypass surgery at age 44. She has plantar fasciitis and a bunion dressing Dr. Ardelle Anton for this. She also has described intermittent purplish discoloration of her toes. Recent Dopplers performed  02/07/17 revealed normal ABIs and TB is with blunted toe PPG is suggesting small vessel disease. I am going to order a sedimentation rate and begin her on amlodipine 2.5 mg a day for vasodilatation. I'll see her back in 3 months for follow-up.      Runell Gess MD FACP,FACC,FAHA, [facility] 02/27/2017 2:33 PM

## 2017-02-27 NOTE — Assessment & Plan Note (Signed)
Angel Casey was referred to me by Dr. Ardelle AntonWagoner for purplish discoloration of her toes. She has no cardiac risk factors other than smoking. Her father did have an myocardial infarction and bypass surgery at age 37. She has plantar fasciitis and a bunion dressing Dr. Ardelle AntonWagoner for this. She also has described intermittent purplish discoloration of her toes. Recent Dopplers performed 02/07/17 revealed normal ABIs and TB is with blunted toe PPG is suggesting small vessel disease. I am going to order a sedimentation rate and begin her on amlodipine 2.5 mg a day for vasodilatation. I'll see her back in 3 months for follow-up.

## 2017-02-28 LAB — SEDIMENTATION RATE: Sed Rate: 2 mm/hr (ref 0–32)

## 2017-03-01 NOTE — Addendum Note (Signed)
Addended by: Chana BodeGREEN, Chimamanda Siegfried L on: 03/01/2017 10:11 AM   Modules accepted: Orders

## 2017-03-08 ENCOUNTER — Ambulatory Visit: Payer: BLUE CROSS/BLUE SHIELD | Admitting: Cardiovascular Disease

## 2017-03-25 DIAGNOSIS — F4323 Adjustment disorder with mixed anxiety and depressed mood: Secondary | ICD-10-CM | POA: Diagnosis not present

## 2017-04-25 DIAGNOSIS — F902 Attention-deficit hyperactivity disorder, combined type: Secondary | ICD-10-CM | POA: Diagnosis not present

## 2017-04-25 DIAGNOSIS — Z79899 Other long term (current) drug therapy: Secondary | ICD-10-CM | POA: Diagnosis not present

## 2017-05-22 DIAGNOSIS — B373 Candidiasis of vulva and vagina: Secondary | ICD-10-CM | POA: Diagnosis not present

## 2017-05-22 DIAGNOSIS — J0140 Acute pansinusitis, unspecified: Secondary | ICD-10-CM | POA: Diagnosis not present

## 2017-07-30 DIAGNOSIS — F902 Attention-deficit hyperactivity disorder, combined type: Secondary | ICD-10-CM | POA: Diagnosis not present

## 2017-07-30 DIAGNOSIS — Z79899 Other long term (current) drug therapy: Secondary | ICD-10-CM | POA: Diagnosis not present

## 2017-07-30 DIAGNOSIS — F429 Obsessive-compulsive disorder, unspecified: Secondary | ICD-10-CM | POA: Diagnosis not present

## 2017-08-13 DIAGNOSIS — Z79899 Other long term (current) drug therapy: Secondary | ICD-10-CM | POA: Diagnosis not present

## 2017-08-13 DIAGNOSIS — F902 Attention-deficit hyperactivity disorder, combined type: Secondary | ICD-10-CM | POA: Diagnosis not present

## 2017-09-18 DIAGNOSIS — Z23 Encounter for immunization: Secondary | ICD-10-CM | POA: Diagnosis not present

## 2017-09-18 DIAGNOSIS — J0100 Acute maxillary sinusitis, unspecified: Secondary | ICD-10-CM | POA: Diagnosis not present

## 2017-10-24 ENCOUNTER — Other Ambulatory Visit: Payer: Self-pay | Admitting: Women's Health

## 2017-10-24 DIAGNOSIS — Z3044 Encounter for surveillance of vaginal ring hormonal contraceptive device: Secondary | ICD-10-CM

## 2017-10-24 NOTE — Telephone Encounter (Signed)
Annual exam on 11/06/17

## 2017-11-01 DIAGNOSIS — F902 Attention-deficit hyperactivity disorder, combined type: Secondary | ICD-10-CM | POA: Diagnosis not present

## 2017-11-01 DIAGNOSIS — Z79899 Other long term (current) drug therapy: Secondary | ICD-10-CM | POA: Diagnosis not present

## 2017-11-04 ENCOUNTER — Telehealth: Payer: Self-pay | Admitting: Cardiovascular Disease

## 2017-11-04 NOTE — Telephone Encounter (Signed)
Will forward to Dr Berry for review ./cy  

## 2017-11-04 NOTE — Telephone Encounter (Signed)
Patient calling, states that her father was recently diagnosed with polycythemia vera (blood cancer) and states that it is hereditary. Patient reports right calf swollen, feet a little numb and poor circulation and states that symptoms are similar to that of PV.   Patient would like to know if she needs to come in for checkup or if she can have blood work done to see what could be causing symptoms. Patient wanted advice before scheduling appointment.

## 2017-11-05 NOTE — Telephone Encounter (Signed)
Needs venous duplex to rule out DVT. If this is negative should probably see PCP

## 2017-11-06 ENCOUNTER — Ambulatory Visit: Payer: BLUE CROSS/BLUE SHIELD | Admitting: Women's Health

## 2017-11-06 ENCOUNTER — Encounter: Payer: Self-pay | Admitting: Women's Health

## 2017-11-06 VITALS — BP 114/74 | Ht 64.0 in | Wt 140.6 lb

## 2017-11-06 DIAGNOSIS — Z01419 Encounter for gynecological examination (general) (routine) without abnormal findings: Secondary | ICD-10-CM | POA: Diagnosis not present

## 2017-11-06 DIAGNOSIS — Z832 Family history of diseases of the blood and blood-forming organs and certain disorders involving the immune mechanism: Secondary | ICD-10-CM | POA: Diagnosis not present

## 2017-11-06 DIAGNOSIS — Z124 Encounter for screening for malignant neoplasm of cervix: Secondary | ICD-10-CM | POA: Diagnosis not present

## 2017-11-06 DIAGNOSIS — Z113 Encounter for screening for infections with a predominantly sexual mode of transmission: Secondary | ICD-10-CM | POA: Diagnosis not present

## 2017-11-06 MED ORDER — ETONOGESTREL-ETHINYL ESTRADIOL 0.12-0.015 MG/24HR VA RING: 1.0000 | VAGINAL_RING | VAGINAL | 4 refills | Status: DC

## 2017-11-06 NOTE — Addendum Note (Signed)
Addended by: Becky SaxPARKER, ROBBIN M on: 11/06/2017 04:35 PM   Modules accepted: Orders

## 2017-11-06 NOTE — Progress Notes (Signed)
Angel Casey C Mallo [birth year] [id number]    History:    Presents for annual exam. Monthly cycle on NuvaRing without complaint. 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 Currently not sexually active, had new partner this past year. 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  ROS:  A ROS was performed and pertinent positives and negatives are included.  Exam:  Vitals:   11/06/17 1455  BP: 114/74  Weight: 140 lb 9.6 oz (63.8 kg)  Height: 5\' 4"  (1.626 m)   Body mass index is 24.13 kg/m.   General appearance:  Normal Thyroid:  Symmetrical, normal in size, without palpable masses or nodularity. Respiratory  Auscultation:  Clear without wheezing or rhonchi Cardiovascular  Auscultation:  Regular rate, without rubs, murmurs or gallops  Edema/varicosities:  Not grossly evident Abdominal  Soft,nontender, without masses, guarding or rebound.  Liver/spleen:  No organomegaly noted  Hernia:  None appreciated  Skin  Inspection:  Numerous patches of granuloma annulare on legs, abdomen   Breasts: Examined lying and sitting.     Right: Without masses, retractions, discharge or axillary adenopathy.     Left: Without masses, retractions, discharge or axillary adenopathy. Gentitourinary   Inguinal/mons:  Normal without inguinal adenopathy  External genitalia:  Normal  BUS/Urethra/Skene's glands:  Normal  Vagina:  Normal  Cervix:  Normal  Uterus:   normal in size, shape and contour.  Midline and mobile  Adnexa/parametria:     Rt: Without masses or tenderness.   Lt: Without masses or tenderness.  Anus and perineum: Normal  Digital rectal exam: Normal sphincter tone without palpated masses or tenderness  Assessment/Plan:  37 y.o. S WF G0  for annual exam with no  complaints.  Monthly cycle on NuvaRing Granuloma annulare-dermatologist manages STD screen 05 CIN-1 normal Paps after  Plan: NuvaRing prescription, proper use, slight risk for blood clots and strokes reviewed. Condoms encouraged if sexually active. SBE's, annual screening mammogram at 40, calcium rich diet, MVI daily encouraged. CBC, glucose, GC/Chlamydia, HIV, hep B, C, RPR. Pap with HR HPV typing, new screening guidelines reviewed. Screening polycythemia versa, father recently diagnosed.    Harrington Challengerancy J Arnella Pralle [facility], 3:43 PM 11/06/2017

## 2017-11-06 NOTE — Patient Instructions (Signed)

## 2017-11-07 LAB — GLUCOSE, RANDOM: Glucose, Bld: 104 mg/dL — ABNORMAL HIGH (ref 65–99)

## 2017-11-07 LAB — CBC WITH DIFFERENTIAL/PLATELET
Basophils Absolute: 63 cells/uL (ref 0–200)
Basophils Relative: 0.7 %
Eosinophils Absolute: 36 cells/uL (ref 15–500)
Eosinophils Relative: 0.4 %
HEMATOCRIT: 41.9 % (ref 35.0–45.0)
Hemoglobin: 14.3 g/dL (ref 11.7–15.5)
LYMPHS ABS: 2619 {cells}/uL (ref 850–3900)
MCH: 31.6 pg (ref 27.0–33.0)
MCHC: 34.1 g/dL (ref 32.0–36.0)
MCV: 92.7 fL (ref 80.0–100.0)
MPV: 10.2 fL (ref 7.5–12.5)
Monocytes Relative: 7.7 %
NEUTROS PCT: 62.1 %
Neutro Abs: 5589 cells/uL (ref 1500–7800)
Platelets: 333 10*3/uL (ref 140–400)
RBC: 4.52 10*6/uL (ref 3.80–5.10)
RDW: 11.8 % (ref 11.0–15.0)
Total Lymphocyte: 29.1 %
WBC: 9 10*3/uL (ref 3.8–10.8)
WBCMIX: 693 {cells}/uL (ref 200–950)

## 2017-11-07 LAB — HEPATITIS C ANTIBODY
Hepatitis C Ab: NONREACTIVE
SIGNAL TO CUT-OFF: 0 (ref <1.00)

## 2017-11-07 LAB — HEPATITIS B SURFACE ANTIGEN: HEP B S AG: NONREACTIVE

## 2017-11-07 LAB — C. TRACHOMATIS/N. GONORRHOEAE RNA
C. TRACHOMATIS RNA, TMA: NOT DETECTED
N. GONORRHOEAE RNA, TMA: NOT DETECTED

## 2017-11-07 LAB — HIV ANTIBODY (ROUTINE TESTING W REFLEX): HIV 1&2 Ab, 4th Generation: NONREACTIVE

## 2017-11-07 LAB — RPR: RPR Ser Ql: NONREACTIVE

## 2017-11-09 LAB — PAP IG W/ RFLX HPV ASCU

## 2017-11-15 ENCOUNTER — Other Ambulatory Visit: Payer: Self-pay | Admitting: Cardiovascular Disease

## 2017-11-15 DIAGNOSIS — I824Y9 Acute embolism and thrombosis of unspecified deep veins of unspecified proximal lower extremity: Secondary | ICD-10-CM

## 2017-11-15 NOTE — Telephone Encounter (Signed)
Left detailed message with recommendations--ok per DPR---and let her know schedulers will be calling to arrange. Asked pt to please call back with any questions.

## 2017-11-15 NOTE — Telephone Encounter (Signed)
Left message for patient to call back.  She needs to schedule venous doppler.

## 2017-11-18 ENCOUNTER — Telehealth: Payer: Self-pay | Admitting: *Deleted

## 2017-11-18 ENCOUNTER — Other Ambulatory Visit: Payer: Self-pay | Admitting: Women's Health

## 2017-11-18 DIAGNOSIS — Z832 Family history of diseases of the blood and blood-forming organs and certain disorders involving the immune mechanism: Secondary | ICD-10-CM

## 2017-11-18 NOTE — Telephone Encounter (Signed)
Left message for patient to call and schedule venous doppler R/O DVT ordered by Dr. Allyson SabalBerry

## 2017-11-18 NOTE — Telephone Encounter (Signed)
Pt called requesting lab result for Screening polycythemia versa, father recently diagnosed on OV 11/06/17. I checked with Joni Reiningicole in lab and it was never drawn. She said the lab was put in future order and the agency was for [facility], it needs to be for [facility] ( if [facility] not option then lab cannot be done here) nancy can you re-enter order. Please advise

## 2017-11-18 NOTE — Telephone Encounter (Signed)
FYI TC apology given, blood test was not drawn. Lap test reentered with [facility] as resulting agency. Will return to office for  lab, will schedule lab appointment.

## 2017-11-18 NOTE — Telephone Encounter (Signed)
Pt called and left message in voicemail requesting std screening results on 11/06/17. I called pt back and left on voicemail "results negative"

## 2017-11-19 NOTE — Telephone Encounter (Signed)
RE: pls sch venous  Received: Today  Message Contents  Angel Casey, Angel Casey, I called this patient she wants to know if this is necessary,she stated her legs are not in any pain no swelling or discoloration. She has called her insurance and has not met her deductible ,so would have to pay out of pocket. Please let me know so I can call her back.    Angel Casey called pt today. Pt would like to know if venous doppler is necessary as her symptoms have resolved. Please advise. Thanks!

## 2017-11-20 NOTE — Telephone Encounter (Signed)
There is no need to repeat lower extremity arterial Doppler studies

## 2017-11-25 DIAGNOSIS — J018 Other acute sinusitis: Secondary | ICD-10-CM | POA: Diagnosis not present

## 2017-11-25 NOTE — Telephone Encounter (Signed)
Left detailed message for pt--ok per DPR--with Dr. Hazle CocaBerry's recommendation.

## 2018-01-23 DIAGNOSIS — Z79899 Other long term (current) drug therapy: Secondary | ICD-10-CM | POA: Diagnosis not present

## 2018-01-23 DIAGNOSIS — F902 Attention-deficit hyperactivity disorder, combined type: Secondary | ICD-10-CM | POA: Diagnosis not present

## 2018-02-25 DIAGNOSIS — L920 Granuloma annulare: Secondary | ICD-10-CM | POA: Diagnosis not present

## 2018-02-25 DIAGNOSIS — D225 Melanocytic nevi of trunk: Secondary | ICD-10-CM | POA: Diagnosis not present

## 2018-02-25 DIAGNOSIS — D224 Melanocytic nevi of scalp and neck: Secondary | ICD-10-CM | POA: Diagnosis not present

## 2018-05-02 DIAGNOSIS — F902 Attention-deficit hyperactivity disorder, combined type: Secondary | ICD-10-CM | POA: Diagnosis not present

## 2018-05-02 DIAGNOSIS — Z79899 Other long term (current) drug therapy: Secondary | ICD-10-CM | POA: Diagnosis not present

## 2018-08-08 DIAGNOSIS — Z79899 Other long term (current) drug therapy: Secondary | ICD-10-CM | POA: Diagnosis not present

## 2018-08-08 DIAGNOSIS — H93299 Other abnormal auditory perceptions, unspecified ear: Secondary | ICD-10-CM | POA: Diagnosis not present

## 2018-08-08 DIAGNOSIS — F902 Attention-deficit hyperactivity disorder, combined type: Secondary | ICD-10-CM | POA: Diagnosis not present

## 2018-09-13 DIAGNOSIS — J0100 Acute maxillary sinusitis, unspecified: Secondary | ICD-10-CM | POA: Diagnosis not present

## 2018-11-10 DIAGNOSIS — Z79899 Other long term (current) drug therapy: Secondary | ICD-10-CM | POA: Diagnosis not present

## 2018-11-10 DIAGNOSIS — F429 Obsessive-compulsive disorder, unspecified: Secondary | ICD-10-CM | POA: Diagnosis not present

## 2018-11-10 DIAGNOSIS — F902 Attention-deficit hyperactivity disorder, combined type: Secondary | ICD-10-CM | POA: Diagnosis not present

## 2018-11-11 ENCOUNTER — Encounter: Payer: Self-pay | Admitting: Women's Health

## 2018-11-11 ENCOUNTER — Ambulatory Visit (INDEPENDENT_AMBULATORY_CARE_PROVIDER_SITE_OTHER): Payer: BLUE CROSS/BLUE SHIELD | Admitting: Women's Health

## 2018-11-11 VITALS — BP 122/80 | Ht 64.0 in | Wt 148.0 lb

## 2018-11-11 DIAGNOSIS — Z113 Encounter for screening for infections with a predominantly sexual mode of transmission: Secondary | ICD-10-CM | POA: Diagnosis not present

## 2018-11-11 DIAGNOSIS — Z01419 Encounter for gynecological examination (general) (routine) without abnormal findings: Secondary | ICD-10-CM

## 2018-11-11 DIAGNOSIS — R87618 Other abnormal cytological findings on specimens from cervix uteri: Secondary | ICD-10-CM | POA: Diagnosis not present

## 2018-11-11 MED ORDER — FLUCONAZOLE 150 MG PO TABS: 150.0000 mg | ORAL_TABLET | Freq: Once | ORAL | 0 refills | Status: AC

## 2018-11-11 MED ORDER — ETONOGESTREL-ETHINYL ESTRADIOL 0.12-0.015 MG/24HR VA RING: 1.0000 | VAGINAL_RING | VAGINAL | 4 refills | Status: DC

## 2018-11-11 NOTE — Progress Notes (Signed)
Angel Casey 19-Jan-1981 [id number]    History:    Presents for annual exam.  Light monthly cycle on NuvaRing without complaint.  New partner in the past year.  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  Dermatology manages granuloma annular.  History of IBS and Raynaud's.  Past medical history, past surgical history, family history and social history were all reviewed and documented in the EPIC chart.  Realtor, doing well with many sales.  Father diabetes and polycythemia versa.  ROS:  A ROS was performed and pertinent positives and negatives are included.  Exam:  Vitals:   11/11/18 1144  BP: 122/80  Weight: 148 lb (67.1 kg)  Height: 5\' 4"  (1.626 m)   Body mass index is 25.4 kg/m.   General appearance:  Normal Thyroid:  Symmetrical, normal in size, without palpable masses or nodularity. Respiratory  Auscultation:  Clear without wheezing or rhonchi Cardiovascular  Auscultation:  Regular rate, without rubs, murmurs or gallops  Edema/varicosities:  Not grossly evident Abdominal  Soft,nontender, without masses, guarding or rebound.  Liver/spleen:  No organomegaly noted  Hernia:  None appreciated  Skin  Inspection:  Grossly normal   Breasts: Examined lying and sitting.     Right: Without masses, retractions, discharge or axillary adenopathy.     Left: Without masses, retractions, discharge or axillary adenopathy. Gentitourinary   Inguinal/mons:  Normal without inguinal adenopathy  External genitalia:  Normal  BUS/Urethra/Skene's glands:  Normal  Vagina:  Normal  Cervix:  Normal  Uterus:  normal in size, shape and contour.  Midline and mobile  Adnexa/parametria:     Rt: Without masses or tenderness.   Lt: Without masses or tenderness.  Anus and perineum: Normal  Digital rectal exam: Normal sphincter tone without palpated masses or tenderness  Assessment/Plan:  38 y.o. S WF G0 for annual exam with no complaints.  Monthly cycle on NuvaRing STD screen  Plan: NuvaRing  prescription, proper use, slight risk for blood clots and strokes reviewed.  Condoms encouraged until permanent partner.  SBEs, annual screening mammogram at 40.  Continue regular cardio type exercise.  Has gained 8 pounds in the past year reviewed importance of decreasing calories/carbs to maintain weight.  MVI daily encouraged.  CBC, glucose, Pap with HR HPV typing, GC/chlamydia, HIV, RPR.    Harrington Challenger [facility], 12:14 PM 11/11/2018

## 2018-11-11 NOTE — Addendum Note (Signed)
Addended by: Tito Dine on: 11/11/2018 12:38 PM   Modules accepted: Orders

## 2018-11-11 NOTE — Patient Instructions (Signed)

## 2018-11-12 LAB — C. TRACHOMATIS/N. GONORRHOEAE RNA
C. trachomatis RNA, TMA: NOT DETECTED
N. GONORRHOEAE RNA, TMA: NOT DETECTED

## 2018-11-13 LAB — HEMOGLOBIN A1C
Hgb A1c MFr Bld: 5 % of total Hgb (ref <5.7)
Mean Plasma Glucose: 97 (calc)
eAG (mmol/L): 5.4 (calc)

## 2018-11-13 LAB — CBC WITH DIFFERENTIAL/PLATELET
ABSOLUTE MONOCYTES: 591 {cells}/uL (ref 200–950)
BASOS PCT: 0.7 %
Basophils Absolute: 51 cells/uL (ref 0–200)
EOS ABS: 29 {cells}/uL (ref 15–500)
Eosinophils Relative: 0.4 %
HCT: 38.3 % (ref 35.0–45.0)
HEMOGLOBIN: 13.3 g/dL (ref 11.7–15.5)
Lymphs Abs: 1927 cells/uL (ref 850–3900)
MCH: 32.9 pg (ref 27.0–33.0)
MCHC: 34.7 g/dL (ref 32.0–36.0)
MCV: 94.8 fL (ref 80.0–100.0)
MONOS PCT: 8.1 %
MPV: 10.3 fL (ref 7.5–12.5)
NEUTROS ABS: 4701 {cells}/uL (ref 1500–7800)
Neutrophils Relative %: 64.4 %
Platelets: 299 10*3/uL (ref 140–400)
RBC: 4.04 10*6/uL (ref 3.80–5.10)
RDW: 11.9 % (ref 11.0–15.0)
Total Lymphocyte: 26.4 %
WBC: 7.3 10*3/uL (ref 3.8–10.8)

## 2018-11-13 LAB — HIV ANTIBODY (ROUTINE TESTING W REFLEX): HIV: NONREACTIVE

## 2018-11-13 LAB — PAP, TP IMAGING W/ HPV RNA, RFLX HPV TYPE 16,18/45: HPV DNA High Risk: NOT DETECTED

## 2018-11-13 LAB — TEST AUTHORIZATION

## 2018-11-13 LAB — GLUCOSE, RANDOM: GLUCOSE: 112 mg/dL — AB (ref 65–99)

## 2018-11-13 LAB — RPR: RPR: NONREACTIVE

## 2019-03-04 DIAGNOSIS — F429 Obsessive-compulsive disorder, unspecified: Secondary | ICD-10-CM | POA: Diagnosis not present

## 2019-03-04 DIAGNOSIS — F902 Attention-deficit hyperactivity disorder, combined type: Secondary | ICD-10-CM | POA: Diagnosis not present

## 2019-03-16 ENCOUNTER — Telehealth: Payer: Self-pay | Admitting: *Deleted

## 2019-03-16 MED ORDER — ETONOGESTREL-ETHINYL ESTRADIOL 0.12-0.015 MG/24HR VA RING: 1.0000 | VAGINAL_RING | VAGINAL | 4 refills | Status: DC

## 2019-03-16 NOTE — Telephone Encounter (Signed)
Patient called requesting on nuvaring, patient had annual exam on 10/2018 Rx was sent wrong pharmacy. Rx sent to correct.

## 2019-03-30 DIAGNOSIS — D225 Melanocytic nevi of trunk: Secondary | ICD-10-CM | POA: Diagnosis not present

## 2019-03-30 DIAGNOSIS — L814 Other melanin hyperpigmentation: Secondary | ICD-10-CM | POA: Diagnosis not present

## 2019-03-30 DIAGNOSIS — L920 Granuloma annulare: Secondary | ICD-10-CM | POA: Diagnosis not present

## 2019-03-30 DIAGNOSIS — D224 Melanocytic nevi of scalp and neck: Secondary | ICD-10-CM | POA: Diagnosis not present

## 2019-03-30 DIAGNOSIS — L700 Acne vulgaris: Secondary | ICD-10-CM | POA: Diagnosis not present

## 2019-06-03 DIAGNOSIS — H93299 Other abnormal auditory perceptions, unspecified ear: Secondary | ICD-10-CM | POA: Diagnosis not present

## 2019-06-03 DIAGNOSIS — F902 Attention-deficit hyperactivity disorder, combined type: Secondary | ICD-10-CM | POA: Diagnosis not present

## 2019-06-03 DIAGNOSIS — F429 Obsessive-compulsive disorder, unspecified: Secondary | ICD-10-CM | POA: Diagnosis not present

## 2019-06-03 DIAGNOSIS — Z79899 Other long term (current) drug therapy: Secondary | ICD-10-CM | POA: Diagnosis not present

## 2019-07-14 ENCOUNTER — Telehealth: Payer: Self-pay | Admitting: *Deleted

## 2019-07-14 NOTE — Telephone Encounter (Signed)
Patient called normally keep nuvaring in refrigerator, but left out in room temperature for about 7-10 hours. Asked if you thought it would be okay to still use them, I told her I think this would be fine as long as not left in directed sunlight.  Agree?

## 2019-07-14 NOTE — Telephone Encounter (Signed)
Patient informed. 

## 2019-07-14 NOTE — Telephone Encounter (Signed)
Yes,  gave her correct information- not in a hot place or direct sunlight.  Room temperature is okay.

## 2019-09-01 DIAGNOSIS — Z79899 Other long term (current) drug therapy: Secondary | ICD-10-CM | POA: Diagnosis not present

## 2019-09-01 DIAGNOSIS — F429 Obsessive-compulsive disorder, unspecified: Secondary | ICD-10-CM | POA: Diagnosis not present

## 2019-09-01 DIAGNOSIS — H93299 Other abnormal auditory perceptions, unspecified ear: Secondary | ICD-10-CM | POA: Diagnosis not present

## 2019-09-01 DIAGNOSIS — F902 Attention-deficit hyperactivity disorder, combined type: Secondary | ICD-10-CM | POA: Diagnosis not present

## 2019-10-09 ENCOUNTER — Ambulatory Visit: Payer: BLUE CROSS/BLUE SHIELD | Admitting: Podiatry

## 2019-10-09 ENCOUNTER — Encounter: Payer: Self-pay | Admitting: Podiatry

## 2019-10-09 ENCOUNTER — Other Ambulatory Visit: Payer: Self-pay

## 2019-10-09 DIAGNOSIS — L539 Erythematous condition, unspecified: Secondary | ICD-10-CM | POA: Diagnosis not present

## 2019-10-09 DIAGNOSIS — L600 Ingrowing nail: Secondary | ICD-10-CM

## 2019-10-09 MED ORDER — CEPHALEXIN 500 MG PO CAPS: 500.0000 mg | ORAL_CAPSULE | Freq: Three times a day (TID) | ORAL | 0 refills | Status: DC

## 2019-10-09 NOTE — Patient Instructions (Signed)

## 2019-10-19 ENCOUNTER — Telehealth: Payer: Self-pay | Admitting: *Deleted

## 2019-10-19 ENCOUNTER — Other Ambulatory Visit: Payer: Self-pay | Admitting: Podiatry

## 2019-10-19 MED ORDER — AMOXICILLIN-POT CLAVULANATE 875-125 MG PO TABS: 1.0000 | ORAL_TABLET | Freq: Two times a day (BID) | ORAL | 0 refills | Status: DC

## 2019-10-19 NOTE — Telephone Encounter (Signed)
Patient called and left a message on the nurse line stating that she did not take the cephalexin right away and when patient did start 3 pills later it made her sick and was up all night and wanted to know if Dr Ardelle Anton could switch her to the augmentin that they had talked about when patient was seen at last appointment. Misty Stanley

## 2019-10-19 NOTE — Telephone Encounter (Signed)
Called patient back and stated that the medicine was sent to the pharmacy and take as directed and per Dr Ardelle Anton would like to see the patient back to see how the toe is doing and patient stated that she would call back on Friday to make appointment for the following week. Misty Stanley

## 2019-10-19 NOTE — Progress Notes (Signed)
Subjective: 39 year old female presents the office today for concerns of her right third toenail becoming swollen and red which is been ongoing for the last couple of months been getting worse.  She denies any drainage or pus.  She states that the toes are not as blue in color is normal.  She does have Raynaud's Denies any systemic complaints such as fevers, chills, nausea, vomiting. No acute changes since last appointment, and no other complaints at this time.   Objective: AAO x3, NAD DP/PT pulses palpable bilaterally, CRT less than 3 seconds Minimal incurvation present to lateral aspect of right third toenail is localized edema erythema.  No drainage or pus there is no ascending cellulitis.  Minimal tenderness palpation. No pain with calf compression, swelling, warmth, erythema  Assessment: Right third toe ingrown toenail with localized erythema  Plan: -All treatment options discussed with the patient including all alternatives, risks, complications.  -Discussed partial nail avulsion.  Like to hold off on this and I prescribed cephalexin.  Recommend Epson salt soaks and antibiotic ointment dressings daily.  If symptoms continue discussed partial nail avulsion. -Patient encouraged to call the office with any questions, concerns, change in symptoms.   Vivi Barrack DPM

## 2019-10-27 ENCOUNTER — Other Ambulatory Visit: Payer: Self-pay | Admitting: Women's Health

## 2019-10-27 ENCOUNTER — Telehealth: Payer: Self-pay

## 2019-10-27 MED ORDER — FLUCONAZOLE 150 MG PO TABS: 150.0000 mg | ORAL_TABLET | Freq: Once | ORAL | 0 refills | Status: AC

## 2019-10-27 NOTE — Telephone Encounter (Signed)
Just recently finished antibiotic and now with vaginal yeast infection sx. Requests Diflucan tab if possible.  Has CE scheduled March 2 with you.

## 2019-10-27 NOTE — Telephone Encounter (Signed)
Left message Diflucan sent to pharmacy call if no relief.

## 2019-11-16 ENCOUNTER — Other Ambulatory Visit: Payer: Self-pay

## 2019-11-17 ENCOUNTER — Ambulatory Visit (INDEPENDENT_AMBULATORY_CARE_PROVIDER_SITE_OTHER): Payer: BC Managed Care – PPO | Admitting: Women's Health

## 2019-11-17 ENCOUNTER — Encounter: Payer: Self-pay | Admitting: Women's Health

## 2019-11-17 VITALS — BP 118/78 | Ht 64.0 in | Wt 151.0 lb

## 2019-11-17 DIAGNOSIS — R5383 Other fatigue: Secondary | ICD-10-CM

## 2019-11-17 DIAGNOSIS — Z113 Encounter for screening for infections with a predominantly sexual mode of transmission: Secondary | ICD-10-CM

## 2019-11-17 DIAGNOSIS — Z01419 Encounter for gynecological examination (general) (routine) without abnormal findings: Secondary | ICD-10-CM

## 2019-11-17 MED ORDER — ETONOGESTREL-ETHINYL ESTRADIOL 0.12-0.015 MG/24HR VA RING: 1.0000 | VAGINAL_RING | VAGINAL | 4 refills | Status: DC

## 2019-11-17 NOTE — Patient Instructions (Signed)
Good to see you today Vit d 1000 daily/MVI daily Health Maintenance, Female Adopting a healthy lifestyle and getting preventive care are important in promoting health and wellness. Ask your health care provider about:  The right schedule for you to have regular tests and exams.  Things you can do on your own to prevent diseases and keep yourself healthy. What should I know about diet, weight, and exercise? Eat a healthy diet   Eat a diet that includes plenty of vegetables, fruits, low-fat dairy products, and lean protein.  Do not eat a lot of foods that are high in solid fats, added sugars, or sodium. Maintain a healthy weight Body mass index (BMI) is used to identify weight problems. It estimates body fat based on height and weight. Your health care provider can help determine your BMI and help you achieve or maintain a healthy weight. Get regular exercise Get regular exercise. This is one of the most important things you can do for your health. Most adults should:  Exercise for at least 150 minutes each week. The exercise should increase your heart rate and make you sweat (moderate-intensity exercise).  Do strengthening exercises at least twice a week. This is in addition to the moderate-intensity exercise.  Spend less time sitting. Even light physical activity can be beneficial. Watch cholesterol and blood lipids Have your blood tested for lipids and cholesterol at 40 years of age, then have this test every 5 years. Have your cholesterol levels checked more often if:  Your lipid or cholesterol levels are high.  You are older than 40 years of age.  You are at high risk for heart disease. What should I know about cancer screening? Depending on your health history and family history, you may need to have cancer screening at various ages. This may include screening for:  Breast cancer.  Cervical cancer.  Colorectal cancer.  Skin cancer.  Lung cancer. What should I know  about heart disease, diabetes, and high blood pressure? Blood pressure and heart disease  High blood pressure causes heart disease and increases the risk of stroke. This is more likely to develop in people who have high blood pressure readings, are of African descent, or are overweight.  Have your blood pressure checked: ? Every 3-5 years if you are 40-88 years of age. ? Every year if you are 40 years old or older. Diabetes Have regular diabetes screenings. This checks your fasting blood sugar level. Have the screening done:  Once every three years after age 40 if you are at a normal weight and have a low risk for diabetes.  More often and at a younger age if you are overweight or have a high risk for diabetes. What should I know about preventing infection? Hepatitis B If you have a higher risk for hepatitis B, you should be screened for this virus. Talk with your health care provider to find out if you are at risk for hepatitis B infection. Hepatitis C Testing is recommended for:  Everyone born from [birth year] through 1965.  Anyone with known risk factors for hepatitis C. Sexually transmitted infections (STIs)  Get screened for STIs, including gonorrhea and chlamydia, if: ? You are sexually active and are younger than 40 years of age. ? You are older than 40 years of age and your health care provider tells you that you are at risk for this type of infection. ? Your sexual activity has changed since you were last screened, and you are at increased risk  for chlamydia or gonorrhea. Ask your health care provider if you are at risk.  Ask your health care provider about whether you are at high risk for HIV. Your health care provider may recommend a prescription medicine to help prevent HIV infection. If you choose to take medicine to prevent HIV, you should first get tested for HIV. You should then be tested every 3 months for as long as you are taking the medicine. Pregnancy  If you are about  to stop having your period (premenopausal) and you may become pregnant, seek counseling before you get pregnant.  Take 400 to 800 micrograms (mcg) of folic acid every day if you become pregnant.  Ask for birth control (contraception) if you want to prevent pregnancy. Osteoporosis and menopause Osteoporosis is a disease in which the bones lose minerals and strength with aging. This can result in bone fractures. If you are 52 years old or older, or if you are at risk for osteoporosis and fractures, ask your health care provider if you should:  Be screened for bone loss.  Take a calcium or vitamin D supplement to lower your risk of fractures.  Be given hormone replacement therapy (HRT) to treat symptoms of menopause. Follow these instructions at home: Lifestyle  Do not use any products that contain nicotine or tobacco, such as cigarettes, e-cigarettes, and chewing tobacco. If you need help quitting, ask your health care provider.  Do not use street drugs.  Do not share needles.  Ask your health care provider for help if you need support or information about quitting drugs. Alcohol use  Do not drink alcohol if: ? Your health care provider tells you not to drink. ? You are pregnant, may be pregnant, or are planning to become pregnant.  If you drink alcohol: ? Limit how much you use to 0-1 drink a day. ? Limit intake if you are breastfeeding.  Be aware of how much alcohol is in your drink. In the [place] one drink equals one 12 oz bottle of beer (355 mL), one 5 oz glass of wine (148 mL), or one 1 oz glass of hard liquor (44 mL). General instructions  Schedule regular health, dental, and eye exams.  Stay current with your vaccines.  Tell your health care provider if: ? You often feel depressed. ? You have ever been abused or do not feel safe at home. Summary  Adopting a healthy lifestyle and getting preventive care are important in promoting health and wellness.  Follow your  health care provider's instructions about healthy diet, exercising, and getting tested or screened for diseases.  Follow your health care provider's instructions on monitoring your cholesterol and blood pressure. This information is not intended to replace advice given to you by your health care provider. Make sure you discuss any questions you have with your health care provider. Document Revised: 08/27/2018 Document Reviewed: 08/27/2018 Elsevier Patient Education  2020 [employer].

## 2019-11-17 NOTE — Progress Notes (Signed)
Angel Casey 07-15-1981 [id number]    History:    Presents for annual exam.  Monthly cycle on NuvaRing.  New partner.  2005 CIN-1 normal Paps after Paps after.  ADD primary care manages.  Granulara annular dermatologist manages.  IBS. Raynauds.   Past medical history, past surgical history, family history and social history were all reviewed and documented in the EPIC chart.  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  Father diabetes polycythemia versa.  ROS:  A ROS was performed and pertinent positives and negatives are included.  Exam:  Vitals:   11/17/19 1403  BP: 118/78  Weight: 151 lb (68.5 kg)  Height: 5\' 4"  (1.626 m)   Body mass index is 25.92 kg/m.   General appearance:  Normal Thyroid:  Symmetrical, normal in size, without palpable masses or nodularity. Respiratory  Auscultation:  Clear without wheezing or rhonchi Cardiovascular  Auscultation:  Regular rate, without rubs, murmurs or gallops  Edema/varicosities:  Not grossly evident Abdominal  Soft,nontender, without masses, guarding or rebound.  Liver/spleen:  No organomegaly noted  Hernia:  None appreciated  Skin  Inspection:  Grossly normal   Breasts: Examined lying and sitting.     Right: Without masses, retractions, discharge or axillary adenopathy.     Left: Without masses, retractions, discharge or axillary adenopathy. Gentitourinary   Inguinal/mons:  Normal without inguinal adenopathy  External genitalia:  Normal  BUS/Urethra/Skene's glands:  Normal  Vagina:  Normal  Cervix:  Normal  Uterus:  normal in size, shape and contour.  Midline and mobile  Adnexa/parametria:     Rt: Without masses or tenderness.   Lt: Without masses or tenderness.  Anus and perineum: Normal  Digital rectal exam: Normal sphincter tone without palpated masses or tenderness  Assessment/Plan:  39 y.o. S WF G0 for annual exam with no complaints of vaginal discharge, urinary symptoms, or abdominal pain.  Reports fatigue  Monthly  cycle on NuvaRing 2005 CIN-1 normal Paps after Granulara annual dermatologist manages STD screen Raynaud's  Plan: NuvaRing prescription, proper use, slight risk of blood clots and strokes reviewed.  SBEs, annual screening mammogram at 40, breast center reviewed.  Continue healthy lifestyle of regular exercise, healthy diet, MVI daily encouraged.  CBC, TSH,CMP, GC/chlamydia, HIV, RPR.  Pap normal 2020, new screening guidelines reviewed.     2006 [facility], 2:51 PM 11/17/2019

## 2019-11-18 LAB — C. TRACHOMATIS/N. GONORRHOEAE RNA
C. trachomatis RNA, TMA: NOT DETECTED
N. gonorrhoeae RNA, TMA: NOT DETECTED

## 2019-11-19 LAB — COMPREHENSIVE METABOLIC PANEL
AG Ratio: 1.7 (calc) (ref 1.0–2.5)
ALT: 27 U/L (ref 6–29)
AST: 20 U/L (ref 10–30)
Albumin: 4.5 g/dL (ref 3.6–5.1)
Alkaline phosphatase (APISO): 44 U/L (ref 31–125)
BUN: 13 mg/dL (ref 7–25)
CO2: 21 mmol/L (ref 20–32)
Calcium: 10 mg/dL (ref 8.6–10.2)
Chloride: 104 mmol/L (ref 98–110)
Creat: 0.7 mg/dL (ref 0.50–1.10)
Globulin: 2.6 g/dL (calc) (ref 1.9–3.7)
Glucose, Bld: 125 mg/dL — ABNORMAL HIGH (ref 65–99)
Potassium: 4.1 mmol/L (ref 3.5–5.3)
Sodium: 136 mmol/L (ref 135–146)
Total Bilirubin: 0.3 mg/dL (ref 0.2–1.2)
Total Protein: 7.1 g/dL (ref 6.1–8.1)

## 2019-11-19 LAB — CBC WITH DIFFERENTIAL/PLATELET
Absolute Monocytes: 589 cells/uL (ref 200–950)
Basophils Absolute: 43 cells/uL (ref 0–200)
Basophils Relative: 0.6 %
Eosinophils Absolute: 28 cells/uL (ref 15–500)
Eosinophils Relative: 0.4 %
HCT: 39.6 % (ref 35.0–45.0)
Hemoglobin: 13.5 g/dL (ref 11.7–15.5)
Lymphs Abs: 2151 cells/uL (ref 850–3900)
MCH: 32.1 pg (ref 27.0–33.0)
MCHC: 34.1 g/dL (ref 32.0–36.0)
MCV: 94.1 fL (ref 80.0–100.0)
MPV: 10.2 fL (ref 7.5–12.5)
Monocytes Relative: 8.3 %
Neutro Abs: 4288 cells/uL (ref 1500–7800)
Neutrophils Relative %: 60.4 %
Platelets: 332 10*3/uL (ref 140–400)
RBC: 4.21 10*6/uL (ref 3.80–5.10)
RDW: 11.6 % (ref 11.0–15.0)
Total Lymphocyte: 30.3 %
WBC: 7.1 10*3/uL (ref 3.8–10.8)

## 2019-11-19 LAB — TEST AUTHORIZATION

## 2019-11-19 LAB — TSH: TSH: 4.1 mIU/L

## 2019-11-19 LAB — RPR: RPR Ser Ql: NONREACTIVE

## 2019-11-19 LAB — HIV ANTIBODY (ROUTINE TESTING W REFLEX): HIV 1&2 Ab, 4th Generation: NONREACTIVE

## 2019-11-19 LAB — HEMOGLOBIN A1C W/OUT EAG: Hgb A1c MFr Bld: 5.2 % of total Hgb (ref <5.7)

## 2019-12-10 DIAGNOSIS — F429 Obsessive-compulsive disorder, unspecified: Secondary | ICD-10-CM | POA: Diagnosis not present

## 2019-12-10 DIAGNOSIS — F902 Attention-deficit hyperactivity disorder, combined type: Secondary | ICD-10-CM | POA: Diagnosis not present

## 2019-12-10 DIAGNOSIS — Z79899 Other long term (current) drug therapy: Secondary | ICD-10-CM | POA: Diagnosis not present

## 2019-12-30 ENCOUNTER — Other Ambulatory Visit: Payer: Self-pay | Admitting: Women's Health

## 2019-12-30 DIAGNOSIS — Z1231 Encounter for screening mammogram for malignant neoplasm of breast: Secondary | ICD-10-CM

## 2020-01-14 DIAGNOSIS — J029 Acute pharyngitis, unspecified: Secondary | ICD-10-CM | POA: Diagnosis not present

## 2020-01-14 DIAGNOSIS — J0141 Acute recurrent pansinusitis: Secondary | ICD-10-CM | POA: Diagnosis not present

## 2020-02-05 ENCOUNTER — Ambulatory Visit: Payer: BLUE CROSS/BLUE SHIELD

## 2020-02-09 ENCOUNTER — Other Ambulatory Visit: Payer: Self-pay

## 2020-02-09 ENCOUNTER — Ambulatory Visit
Admission: RE | Admit: 2020-02-09 | Discharge: 2020-02-09 | Disposition: A | Discharge status: Home / Self Care | Payer: BC Managed Care – PPO | Source: Ambulatory Visit | Attending: Women's Health | Admitting: Women's Health

## 2020-02-09 DIAGNOSIS — Z1231 Encounter for screening mammogram for malignant neoplasm of breast: Secondary | ICD-10-CM

## 2020-03-15 DIAGNOSIS — F429 Obsessive-compulsive disorder, unspecified: Secondary | ICD-10-CM | POA: Diagnosis not present

## 2020-03-15 DIAGNOSIS — Z79899 Other long term (current) drug therapy: Secondary | ICD-10-CM | POA: Diagnosis not present

## 2020-03-15 DIAGNOSIS — F902 Attention-deficit hyperactivity disorder, combined type: Secondary | ICD-10-CM | POA: Diagnosis not present

## 2020-04-12 DIAGNOSIS — D225 Melanocytic nevi of trunk: Secondary | ICD-10-CM | POA: Diagnosis not present

## 2020-04-12 DIAGNOSIS — D224 Melanocytic nevi of scalp and neck: Secondary | ICD-10-CM | POA: Diagnosis not present

## 2020-04-12 DIAGNOSIS — L920 Granuloma annulare: Secondary | ICD-10-CM | POA: Diagnosis not present

## 2020-04-12 DIAGNOSIS — L814 Other melanin hyperpigmentation: Secondary | ICD-10-CM | POA: Diagnosis not present

## 2020-05-24 ENCOUNTER — Telehealth: Payer: Self-pay

## 2020-05-24 NOTE — Telephone Encounter (Addendum)
Note received from phamacy:  "Manufacture discontinue-please sent a new prescription".  Regarding Nuvaring 0.12-0.015

## 2020-05-24 NOTE — Telephone Encounter (Signed)
Which medication is it for

## 2020-05-24 NOTE — Telephone Encounter (Signed)
Left message with the pharmacy to please advise.

## 2020-06-16 DIAGNOSIS — F429 Obsessive-compulsive disorder, unspecified: Secondary | ICD-10-CM | POA: Diagnosis not present

## 2020-06-16 DIAGNOSIS — F902 Attention-deficit hyperactivity disorder, combined type: Secondary | ICD-10-CM | POA: Diagnosis not present

## 2020-06-16 DIAGNOSIS — Z79899 Other long term (current) drug therapy: Secondary | ICD-10-CM | POA: Diagnosis not present

## 2020-06-22 DIAGNOSIS — Z1159 Encounter for screening for other viral diseases: Secondary | ICD-10-CM | POA: Diagnosis not present

## 2020-06-24 DIAGNOSIS — R059 Cough, unspecified: Secondary | ICD-10-CM | POA: Diagnosis not present

## 2020-06-24 DIAGNOSIS — J189 Pneumonia, unspecified organism: Secondary | ICD-10-CM | POA: Diagnosis not present

## 2020-06-24 DIAGNOSIS — Z8616 Personal history of COVID-19: Secondary | ICD-10-CM | POA: Diagnosis not present

## 2020-09-27 DIAGNOSIS — F429 Obsessive-compulsive disorder, unspecified: Secondary | ICD-10-CM | POA: Diagnosis not present

## 2020-09-27 DIAGNOSIS — Z79899 Other long term (current) drug therapy: Secondary | ICD-10-CM | POA: Diagnosis not present

## 2020-09-27 DIAGNOSIS — F902 Attention-deficit hyperactivity disorder, combined type: Secondary | ICD-10-CM | POA: Diagnosis not present

## 2020-11-21 ENCOUNTER — Encounter: Payer: Self-pay | Admitting: Nurse Practitioner

## 2020-11-21 ENCOUNTER — Other Ambulatory Visit: Payer: Self-pay

## 2020-11-21 ENCOUNTER — Ambulatory Visit (INDEPENDENT_AMBULATORY_CARE_PROVIDER_SITE_OTHER): Payer: BC Managed Care – PPO | Admitting: Nurse Practitioner

## 2020-11-21 VITALS — BP 134/82 | Ht 64.0 in | Wt 150.0 lb

## 2020-11-21 DIAGNOSIS — Z01419 Encounter for gynecological examination (general) (routine) without abnormal findings: Secondary | ICD-10-CM

## 2020-11-21 DIAGNOSIS — Z3044 Encounter for surveillance of vaginal ring hormonal contraceptive device: Secondary | ICD-10-CM | POA: Diagnosis not present

## 2020-11-21 DIAGNOSIS — R030 Elevated blood-pressure reading, without diagnosis of hypertension: Secondary | ICD-10-CM

## 2020-11-21 MED ORDER — ETONOGESTREL-ETHINYL ESTRADIOL 0.12-0.015 MG/24HR VA RING: 1.0000 | VAGINAL_RING | VAGINAL | 4 refills | Status: DC

## 2020-11-21 NOTE — Patient Instructions (Signed)
Health Maintenance, Female Adopting a healthy lifestyle and getting preventive care are important in promoting health and wellness. Ask your health care provider about:  The right schedule for you to have regular tests and exams.  Things you can do on your own to prevent diseases and keep yourself healthy. What should I know about diet, weight, and exercise? Eat a healthy diet  Eat a diet that includes plenty of vegetables, fruits, low-fat dairy products, and lean protein.  Do not eat a lot of foods that are high in solid fats, added sugars, or sodium.   Maintain a healthy weight Body mass index (BMI) is used to identify weight problems. It estimates body fat based on height and weight. Your health care provider can help determine your BMI and help you achieve or maintain a healthy weight. Get regular exercise Get regular exercise. This is one of the most important things you can do for your health. Most adults should:  Exercise for at least 150 minutes each week. The exercise should increase your heart rate and make you sweat (moderate-intensity exercise).  Do strengthening exercises at least twice a week. This is in addition to the moderate-intensity exercise.  Spend less time sitting. Even light physical activity can be beneficial. Watch cholesterol and blood lipids Have your blood tested for lipids and cholesterol at 41 years of age, then have this test every 5 years. Have your cholesterol levels checked more often if:  Your lipid or cholesterol levels are high.  You are older than 40 years of age.  You are at high risk for heart disease. What should I know about cancer screening? Depending on your health history and family history, you may need to have cancer screening at various ages. This may include screening for:  Breast cancer.  Cervical cancer.  Colorectal cancer.  Skin cancer.  Lung cancer. What should I know about heart disease, diabetes, and high blood  pressure? Blood pressure and heart disease  High blood pressure causes heart disease and increases the risk of stroke. This is more likely to develop in people who have high blood pressure readings, are of African descent, or are overweight.  Have your blood pressure checked: ? Every 3-5 years if you are 18-39 years of age. ? Every year if you are 40 years old or older. Diabetes Have regular diabetes screenings. This checks your fasting blood sugar level. Have the screening done:  Once every three years after age 40 if you are at a normal weight and have a low risk for diabetes.  More often and at a younger age if you are overweight or have a high risk for diabetes. What should I know about preventing infection? Hepatitis B If you have a higher risk for hepatitis B, you should be screened for this virus. Talk with your health care provider to find out if you are at risk for hepatitis B infection. Hepatitis C Testing is recommended for:  Everyone born from 1945 through 1965.  Anyone with known risk factors for hepatitis C. Sexually transmitted infections (STIs)  Get screened for STIs, including gonorrhea and chlamydia, if: ? You are sexually active and are younger than 41 years of age. ? You are older than 41 years of age and your health care provider tells you that you are at risk for this type of infection. ? Your sexual activity has changed since you were last screened, and you are at increased risk for chlamydia or gonorrhea. Ask your health care provider   if you are at risk.  Ask your health care provider about whether you are at high risk for HIV. Your health care provider may recommend a prescription medicine to help prevent HIV infection. If you choose to take medicine to prevent HIV, you should first get tested for HIV. You should then be tested every 3 months for as long as you are taking the medicine. Pregnancy  If you are about to stop having your period (premenopausal) and  you may become pregnant, seek counseling before you get pregnant.  Take 400 to 800 micrograms (mcg) of folic acid every day if you become pregnant.  Ask for birth control (contraception) if you want to prevent pregnancy. Osteoporosis and menopause Osteoporosis is a disease in which the bones lose minerals and strength with aging. This can result in bone fractures. If you are 65 years old or older, or if you are at risk for osteoporosis and fractures, ask your health care provider if you should:  Be screened for bone loss.  Take a calcium or vitamin D supplement to lower your risk of fractures.  Be given hormone replacement therapy (HRT) to treat symptoms of menopause. Follow these instructions at home: Lifestyle  Do not use any products that contain nicotine or tobacco, such as cigarettes, e-cigarettes, and chewing tobacco. If you need help quitting, ask your health care provider.  Do not use street drugs.  Do not share needles.  Ask your health care provider for help if you need support or information about quitting drugs. Alcohol use  Do not drink alcohol if: ? Your health care provider tells you not to drink. ? You are pregnant, may be pregnant, or are planning to become pregnant.  If you drink alcohol: ? Limit how much you use to 0-1 drink a day. ? Limit intake if you are breastfeeding.  Be aware of how much alcohol is in your drink. In the [place] one drink equals one 12 oz bottle of beer (355 mL), one 5 oz glass of wine (148 mL), or one 1 oz glass of hard liquor (44 mL). General instructions  Schedule regular health, dental, and eye exams.  Stay current with your vaccines.  Tell your health care provider if: ? You often feel depressed. ? You have ever been abused or do not feel safe at home. Summary  Adopting a healthy lifestyle and getting preventive care are important in promoting health and wellness.  Follow your health care provider's instructions about healthy  diet, exercising, and getting tested or screened for diseases.  Follow your health care provider's instructions on monitoring your cholesterol and blood pressure. This information is not intended to replace advice given to you by your health care provider. Make sure you discuss any questions you have with your health care provider. Document Revised: 08/27/2018 Document Reviewed: 08/27/2018 Elsevier Patient Education  2021 Elsevier Inc.  

## 2020-11-21 NOTE — Progress Notes (Signed)
   Angel Casey 1980/12/13 [id number]   History:  40 y.o. G0 presents for annual exam without GYN complaints. Monthly cycle/Nuvaring. 2005 CIN-1, subsequent paps normal. Normal mammogram history. Blood pressure has been elevated at multiple doctors visits since October when she had Covid. Father with history MI. Sees dermatology.  Gynecologic History Patient's last menstrual period was 11/07/2020.   Contraception/Family planning: NuvaRing vaginal inserts  Health Maintenance Last Pap: 11/11/2018. Results were: normal Last mammogram: 02/10/2020. Results were: normal Last colonoscopy: N/A Last Dexa: N/A  Past medical history, past surgical history, family history and social history were all reviewed and documented in the EPIC chart.  ROS:  A ROS was performed and pertinent positives and negatives are included.  Exam:  Vitals:   11/21/20 1406 11/21/20 1439  BP: (!) 150/98 134/82  Weight: 150 lb (68 kg)   Height: 5\' 4"  (1.626 m)    Body mass index is 25.75 kg/m.  General appearance:  Normal Thyroid:  Symmetrical, normal in size, without palpable masses or nodularity. Respiratory  Auscultation:  Clear without wheezing or rhonchi Cardiovascular  Auscultation:  Regular rate, without rubs, murmurs or gallops  Edema/varicosities:  Not grossly evident Abdominal  Soft,nontender, without masses, guarding or rebound.  Liver/spleen:  No organomegaly noted  Hernia:  None appreciated  Skin  Inspection:  Grossly normal   Breasts: Examined lying and sitting.   Right: Without masses, retractions, discharge or axillary adenopathy.   Left: Without masses, retractions, discharge or axillary adenopathy. Gentitourinary   Inguinal/mons:  Normal without inguinal adenopathy  External genitalia:  Normal  BUS/Urethra/Skene's glands:  Normal  Vagina:  Normal  Cervix:  Normal  Uterus:  Normal in size, shape and contour.  Midline and mobile  Adnexa/parametria:     Rt: Without masses or  tenderness.   Lt: Without masses or tenderness.  Anus and perineum: Normal  Assessment/Plan:  40 y.o. G0 for annual exam.   Well female exam with routine gynecological exam - Plan: CBC with Differential/Platelet, Comprehensive metabolic panel, Lipid panel, TSH. Education provided on SBEs, importance of preventative screenings, current guidelines, high calcium diet, regular exercise, and multivitamin daily. She will return fasting for lab work.   Encounter for surveillance of vaginal ring hormonal contraceptive device - Plan: etonogestrel-ethinyl estradiol (NUVARING) 0.12-0.015 MG/24HR vaginal ring. She is using as prescribed. Refill x 1 year provided.   Elevated blood pressure reading - 150/98 at start of visit, recheck was 134/82. Recommend establishing with PCP for management and keeping diary of blood pressures until then. Instructed to go to ER if she experiences blurred vision, sudden onset headache, or dizziness.   Screening for cervical cancer - 2005 CIN-1, subsequent paps normal. Will repeat at 5-year interval per guidelines.   Screening for breast cancer - Normal mammogram history.  Continue annual screenings.  Normal breast exam today.  Follow up in 1 year for annual.      2006 [facility], 3:27 PM 11/21/2020

## 2020-11-29 ENCOUNTER — Other Ambulatory Visit: Payer: Self-pay

## 2020-11-29 ENCOUNTER — Other Ambulatory Visit (INDEPENDENT_AMBULATORY_CARE_PROVIDER_SITE_OTHER): Payer: BC Managed Care – PPO

## 2020-11-29 DIAGNOSIS — Z01419 Encounter for gynecological examination (general) (routine) without abnormal findings: Secondary | ICD-10-CM | POA: Diagnosis not present

## 2020-11-30 ENCOUNTER — Other Ambulatory Visit: Payer: Self-pay | Admitting: Nurse Practitioner

## 2020-11-30 DIAGNOSIS — R7989 Other specified abnormal findings of blood chemistry: Secondary | ICD-10-CM

## 2020-11-30 LAB — CBC WITH DIFFERENTIAL/PLATELET
Absolute Monocytes: 644 cells/uL (ref 200–950)
Basophils Absolute: 52 cells/uL (ref 0–200)
Basophils Relative: 0.8 %
Eosinophils Absolute: 98 cells/uL (ref 15–500)
Eosinophils Relative: 1.5 %
HCT: 39.3 % (ref 35.0–45.0)
Hemoglobin: 13.5 g/dL (ref 11.7–15.5)
Lymphs Abs: 2535 cells/uL (ref 850–3900)
MCH: 32.8 pg (ref 27.0–33.0)
MCHC: 34.4 g/dL (ref 32.0–36.0)
MCV: 95.4 fL (ref 80.0–100.0)
MPV: 11.2 fL (ref 7.5–12.5)
Monocytes Relative: 9.9 %
Neutro Abs: 3172 cells/uL (ref 1500–7800)
Neutrophils Relative %: 48.8 %
Platelets: 299 10*3/uL (ref 140–400)
RBC: 4.12 10*6/uL (ref 3.80–5.10)
RDW: 11.6 % (ref 11.0–15.0)
Total Lymphocyte: 39 %
WBC: 6.5 10*3/uL (ref 3.8–10.8)

## 2020-11-30 LAB — COMPREHENSIVE METABOLIC PANEL
AG Ratio: 1.8 (calc) (ref 1.0–2.5)
ALT: 14 U/L (ref 6–29)
AST: 13 U/L (ref 10–30)
Albumin: 4.3 g/dL (ref 3.6–5.1)
Alkaline phosphatase (APISO): 37 U/L (ref 31–125)
BUN: 19 mg/dL (ref 7–25)
CO2: 23 mmol/L (ref 20–32)
Calcium: 9.6 mg/dL (ref 8.6–10.2)
Chloride: 105 mmol/L (ref 98–110)
Creat: 0.72 mg/dL (ref 0.50–1.10)
Globulin: 2.4 g/dL (calc) (ref 1.9–3.7)
Glucose, Bld: 100 mg/dL — ABNORMAL HIGH (ref 65–99)
Potassium: 4.6 mmol/L (ref 3.5–5.3)
Sodium: 136 mmol/L (ref 135–146)
Total Bilirubin: 0.3 mg/dL (ref 0.2–1.2)
Total Protein: 6.7 g/dL (ref 6.1–8.1)

## 2020-11-30 LAB — LIPID PANEL
Cholesterol: 194 mg/dL (ref <200)
HDL: 80 mg/dL (ref >=50)
LDL Cholesterol (Calc): 85 mg/dL (calc)
Non-HDL Cholesterol (Calc): 114 mg/dL (calc) (ref <130)
Total CHOL/HDL Ratio: 2.4 (calc) (ref <5.0)
Triglycerides: 197 mg/dL — ABNORMAL HIGH (ref <150)

## 2020-11-30 LAB — TSH: TSH: 4.8 mIU/L — ABNORMAL HIGH

## 2020-12-22 DIAGNOSIS — F429 Obsessive-compulsive disorder, unspecified: Secondary | ICD-10-CM | POA: Diagnosis not present

## 2020-12-22 DIAGNOSIS — Z79899 Other long term (current) drug therapy: Secondary | ICD-10-CM | POA: Diagnosis not present

## 2020-12-22 DIAGNOSIS — F902 Attention-deficit hyperactivity disorder, combined type: Secondary | ICD-10-CM | POA: Diagnosis not present

## 2020-12-27 ENCOUNTER — Other Ambulatory Visit (INDEPENDENT_AMBULATORY_CARE_PROVIDER_SITE_OTHER): Payer: BC Managed Care – PPO

## 2020-12-27 ENCOUNTER — Other Ambulatory Visit: Payer: Self-pay

## 2020-12-27 DIAGNOSIS — R7989 Other specified abnormal findings of blood chemistry: Secondary | ICD-10-CM

## 2020-12-28 LAB — THYROID PANEL WITH TSH
Free Thyroxine Index: 1.8 (ref 1.4–3.8)
T3 Uptake: 24 % (ref 22–35)
T4, Total: 7.4 ug/dL (ref 5.1–11.9)
TSH: 3.46 mIU/L

## 2020-12-30 ENCOUNTER — Other Ambulatory Visit: Payer: Self-pay | Admitting: Nurse Practitioner

## 2020-12-30 DIAGNOSIS — Z1231 Encounter for screening mammogram for malignant neoplasm of breast: Secondary | ICD-10-CM

## 2021-02-16 ENCOUNTER — Telehealth: Payer: Self-pay | Admitting: *Deleted

## 2021-02-16 NOTE — Telephone Encounter (Signed)
Patient left voicemail in regards to using nuvaring while traveling.   RN returned call to patient. Patient states she is due to go out of the country this weekend and wondering if she can skip her cycle while she travels. RN advised could leave nuvaring in and then instead of leaving nuvaring out to induce cycle, just replace with new ring. Advised could have some spotting as body adjusts. Patient verbalized understanding. States she will use a back up method just for peace of mind.   Routing to provider and will close encounter.

## 2021-02-17 ENCOUNTER — Other Ambulatory Visit: Payer: Self-pay

## 2021-02-17 ENCOUNTER — Ambulatory Visit
Admission: RE | Admit: 2021-02-17 | Discharge: 2021-02-17 | Disposition: A | Discharge status: Home / Self Care | Payer: BC Managed Care – PPO | Source: Ambulatory Visit | Attending: Nurse Practitioner | Admitting: Nurse Practitioner

## 2021-02-17 DIAGNOSIS — Z1231 Encounter for screening mammogram for malignant neoplasm of breast: Secondary | ICD-10-CM | POA: Diagnosis not present

## 2021-02-19 NOTE — Telephone Encounter (Signed)
Thank you [name].

## 2021-03-23 DIAGNOSIS — F902 Attention-deficit hyperactivity disorder, combined type: Secondary | ICD-10-CM | POA: Diagnosis not present

## 2021-03-23 DIAGNOSIS — Z79899 Other long term (current) drug therapy: Secondary | ICD-10-CM | POA: Diagnosis not present

## 2021-04-17 DIAGNOSIS — L709 Acne, unspecified: Secondary | ICD-10-CM | POA: Diagnosis not present

## 2021-04-17 DIAGNOSIS — D225 Melanocytic nevi of trunk: Secondary | ICD-10-CM | POA: Diagnosis not present

## 2021-04-17 DIAGNOSIS — D224 Melanocytic nevi of scalp and neck: Secondary | ICD-10-CM | POA: Diagnosis not present

## 2021-04-17 DIAGNOSIS — L814 Other melanin hyperpigmentation: Secondary | ICD-10-CM | POA: Diagnosis not present

## 2021-04-17 DIAGNOSIS — Z79899 Other long term (current) drug therapy: Secondary | ICD-10-CM | POA: Diagnosis not present

## 2021-04-17 DIAGNOSIS — L578 Other skin changes due to chronic exposure to nonionizing radiation: Secondary | ICD-10-CM | POA: Diagnosis not present

## 2021-05-02 DIAGNOSIS — R030 Elevated blood-pressure reading, without diagnosis of hypertension: Secondary | ICD-10-CM | POA: Diagnosis not present

## 2021-05-02 DIAGNOSIS — J069 Acute upper respiratory infection, unspecified: Secondary | ICD-10-CM | POA: Diagnosis not present

## 2021-06-22 DIAGNOSIS — Z79899 Other long term (current) drug therapy: Secondary | ICD-10-CM | POA: Diagnosis not present

## 2021-06-22 DIAGNOSIS — F902 Attention-deficit hyperactivity disorder, combined type: Secondary | ICD-10-CM | POA: Diagnosis not present

## 2021-08-02 DIAGNOSIS — R030 Elevated blood-pressure reading, without diagnosis of hypertension: Secondary | ICD-10-CM | POA: Diagnosis not present

## 2021-11-22 ENCOUNTER — Ambulatory Visit (INDEPENDENT_AMBULATORY_CARE_PROVIDER_SITE_OTHER): Payer: BC Managed Care – PPO | Admitting: Nurse Practitioner

## 2021-11-22 ENCOUNTER — Other Ambulatory Visit: Payer: Self-pay

## 2021-11-22 ENCOUNTER — Encounter: Payer: Self-pay | Admitting: Nurse Practitioner

## 2021-11-22 VITALS — BP 124/84 | Ht 64.5 in | Wt 159.0 lb

## 2021-11-22 DIAGNOSIS — Z3044 Encounter for surveillance of vaginal ring hormonal contraceptive device: Secondary | ICD-10-CM

## 2021-11-22 DIAGNOSIS — J0110 Acute frontal sinusitis, unspecified: Secondary | ICD-10-CM | POA: Diagnosis not present

## 2021-11-22 DIAGNOSIS — Z01419 Encounter for gynecological examination (general) (routine) without abnormal findings: Secondary | ICD-10-CM

## 2021-11-22 MED ORDER — AMOXICILLIN-POT CLAVULANATE 875-125 MG PO TABS: 1.0000 | ORAL_TABLET | Freq: Two times a day (BID) | ORAL | 0 refills | Status: AC

## 2021-11-22 MED ORDER — ETONOGESTREL-ETHINYL ESTRADIOL 0.12-0.015 MG/24HR VA RING: 1.0000 | VAGINAL_RING | VAGINAL | 3 refills | Status: DC

## 2021-11-22 NOTE — Progress Notes (Signed)
? Angel Casey [birth year] [id number] ? ? ?History:  41 y.o. G0 presents for annual exam without GYN complaints. Monthly cycle/Nuvaring. 2005 CIN-1, subsequent paps normal. Normal mammogram history. Established with PCP last year due to elevated blood pressures. This has improved since she started exercising and avoiding OTC cold medications. Complains of one month of congestion with frontal sinus pain and now beginning to hurt below ears. She has seasonal allergies and history of sinus infections. She takes daily allergy pills and uses flonase with no improvement. No fevers or chills. Had mildly elevated TSH last year, normal recheck.  ? ?Gynecologic History ?Patient's last menstrual period was 11/08/2021. ?Period Cycle (Days): 28 ?Period Duration (Days): 5 ?Period Pattern: Regular ?Menstrual Flow: Moderate ?Dysmenorrhea: (!) Mild ?Dysmenorrhea Symptoms: Cramping ?Contraception/Family planning: NuvaRing vaginal inserts ?Sexually active: Yes ? ?Health Maintenance ?Last Pap: 11/11/2018. Results were: Normal, 5-year repeat ?Last mammogram: 02/17/2021. Results were: Normal ?Last colonoscopy: Not indicated ?Last Dexa: Not indicated ? ?Past medical history, past surgical history, family history and social history were all reviewed and documented in the EPIC chart. 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  ? ?ROS:  A ROS was performed and pertinent positives and negatives are included. ? ?Exam: ? ?Vitals:  ? 11/22/21 1012  ?BP: 124/84  ?Weight: 159 lb (72.1 kg)  ?Height: 5' 4.5" (1.638 m)  ? ? ?Body mass index is 26.87 kg/m?. ? ?General appearance:  Normal ?Thyroid:  Symmetrical, normal in size, without palpable masses or nodularity. ?Respiratory ? Auscultation:  Clear without wheezing or rhonchi ?Cardiovascular ? Auscultation:  Regular rate, without rubs, murmurs or gallops ? Edema/varicosities:  Not grossly evident ?Abdominal ? Soft,nontender, without masses, guarding or rebound. ? Liver/spleen:  No organomegaly noted ? Hernia:   None appreciated ? Skin ? Inspection:  Grossly normal ?  ?Breasts: Examined lying and sitting.  ? Right: Without masses, retractions, discharge or axillary adenopathy. ? ? Left: Without masses, retractions, discharge or axillary adenopathy. ?Genitourinary  ? Inguinal/mons:  Normal without inguinal adenopathy ? External genitalia:  Normal appearing vulva with no masses, tenderness, or lesions ? BUS/Urethra/Skene's glands:  Normal ? Vagina:  Normal appearing with normal color and discharge, no lesions ? Cervix:  Normal appearing without discharge or lesions ? Uterus:  Normal in size, shape and contour.  Midline and mobile, nontender ? Adnexa/parametria:   ?  Rt: Normal in size, without masses or tenderness. ?  Lt: Normal in size, without masses or tenderness. ? Anus and perineum: Normal ? Digital rectal exam: Normal sphincter tone without palpated masses or tenderness ? ?Patient informed chaperone available to be present for breast and pelvic exam. Patient has requested no chaperone to be present. Patient has been advised what will be completed during breast and pelvic exam.  ? ?Assessment/Plan:  41 y.o. G0 for annual exam.  ? ?Well female exam with routine gynecological exam - Education provided on SBEs, importance of preventative screenings, current guidelines, high calcium diet, regular exercise, and multivitamin daily. Labs with PCP in November.  ? ?Encounter for surveillance of vaginal ring hormonal contraceptive device - Plan: etonogestrel-ethinyl estradiol (NUVARING) 0.12-0.015 MG/24HR vaginal ring. She is using as prescribed. Refill x 1 year provided.  ? ?Acute non-recurrent frontal sinusitis - Plan: amoxicillin-clavulanate (AUGMENTIN) 875-125 MG tablet BID x 10 days. If no improvement she will follow up with PCP. Do nasal rinses, increase Flonase to twice daily until symptoms are improved and then go back to once daily.  ? ?Screening for cervical cancer - 2005 CIN-1, subsequent paps normal.  Will repeat at  5-year interval per guidelines.  ? ?Screening for breast cancer - Normal mammogram history.  Continue annual screenings.  Normal breast exam today. ? ?Follow up in 1 year for annual.  ? ? ? ? ?[name], 10:38 AM 11/22/2021 ? ?

## 2021-11-30 DIAGNOSIS — Z79899 Other long term (current) drug therapy: Secondary | ICD-10-CM | POA: Diagnosis not present

## 2021-11-30 DIAGNOSIS — F902 Attention-deficit hyperactivity disorder, combined type: Secondary | ICD-10-CM | POA: Diagnosis not present

## 2021-12-14 ENCOUNTER — Telehealth: Payer: Self-pay | Admitting: *Deleted

## 2021-12-14 NOTE — Telephone Encounter (Signed)
Patient called and left detailed message in triage voicemail stating she was to remove her nuvaring on 12/04/21 however forgot to remove ring and still has ring in now. Patient asked if she should remove ring now and have cycle or start new ring. I called patient and left detailed message on cell per DPR access to remove ring now and have cycle. I asked her to call if questions.  ?

## 2022-01-23 DIAGNOSIS — R7989 Other specified abnormal findings of blood chemistry: Secondary | ICD-10-CM | POA: Diagnosis not present

## 2022-01-23 DIAGNOSIS — R7301 Impaired fasting glucose: Secondary | ICD-10-CM | POA: Diagnosis not present

## 2022-01-30 DIAGNOSIS — Z23 Encounter for immunization: Secondary | ICD-10-CM | POA: Diagnosis not present

## 2022-01-30 DIAGNOSIS — R7301 Impaired fasting glucose: Secondary | ICD-10-CM | POA: Diagnosis not present

## 2022-01-30 DIAGNOSIS — Z0000 Encounter for general adult medical examination without abnormal findings: Secondary | ICD-10-CM | POA: Diagnosis not present

## 2022-01-30 DIAGNOSIS — Z1339 Encounter for screening examination for other mental health and behavioral disorders: Secondary | ICD-10-CM | POA: Diagnosis not present

## 2022-01-30 DIAGNOSIS — Z1331 Encounter for screening for depression: Secondary | ICD-10-CM | POA: Diagnosis not present

## 2022-04-12 ENCOUNTER — Other Ambulatory Visit: Payer: Self-pay | Admitting: Nurse Practitioner

## 2022-04-12 DIAGNOSIS — Z1231 Encounter for screening mammogram for malignant neoplasm of breast: Secondary | ICD-10-CM

## 2022-05-01 ENCOUNTER — Ambulatory Visit
Admission: RE | Admit: 2022-05-01 | Discharge: 2022-05-01 | Disposition: A | Discharge status: Home / Self Care | Payer: BC Managed Care – PPO | Source: Ambulatory Visit | Attending: Nurse Practitioner | Admitting: Nurse Practitioner

## 2022-05-01 DIAGNOSIS — Z1231 Encounter for screening mammogram for malignant neoplasm of breast: Secondary | ICD-10-CM | POA: Diagnosis not present

## 2022-05-29 DIAGNOSIS — Z79899 Other long term (current) drug therapy: Secondary | ICD-10-CM | POA: Diagnosis not present

## 2022-05-29 DIAGNOSIS — F902 Attention-deficit hyperactivity disorder, combined type: Secondary | ICD-10-CM | POA: Diagnosis not present

## 2022-06-01 DIAGNOSIS — F902 Attention-deficit hyperactivity disorder, combined type: Secondary | ICD-10-CM | POA: Diagnosis not present

## 2022-06-01 DIAGNOSIS — Z79899 Other long term (current) drug therapy: Secondary | ICD-10-CM | POA: Diagnosis not present

## 2022-06-22 DIAGNOSIS — R059 Cough, unspecified: Secondary | ICD-10-CM | POA: Diagnosis not present

## 2022-06-22 DIAGNOSIS — R7301 Impaired fasting glucose: Secondary | ICD-10-CM | POA: Diagnosis not present

## 2022-09-06 ENCOUNTER — Ambulatory Visit: Payer: BC Managed Care – PPO | Admitting: Podiatry

## 2022-09-06 ENCOUNTER — Ambulatory Visit (INDEPENDENT_AMBULATORY_CARE_PROVIDER_SITE_OTHER): Payer: BC Managed Care – PPO

## 2022-09-06 DIAGNOSIS — M2011 Hallux valgus (acquired), right foot: Secondary | ICD-10-CM

## 2022-09-06 DIAGNOSIS — M79674 Pain in right toe(s): Secondary | ICD-10-CM

## 2022-09-06 DIAGNOSIS — I7300 Raynaud's syndrome without gangrene: Secondary | ICD-10-CM

## 2022-09-06 DIAGNOSIS — M7989 Other specified soft tissue disorders: Secondary | ICD-10-CM | POA: Diagnosis not present

## 2022-09-06 NOTE — Progress Notes (Signed)
Subjective:   Patient ID: Angel Casey, female   DOB: 41 y.o.   MRN: [id number]   HPI Chief Complaint  Patient presents with   Toe Pain    Rm 11  Right 2nd and 3rd toe edema, bruising and redness. Pt states there isn't much pain just aggravation. Pt states she had raynaud's in both feet.    41 year old female presents the office today with above concerns.  She has noticed some swelling, purple and red discoloration mostly to her second third toes on the right foot.  She does not recall any injury symptoms started.  No open lesions/ulcerations.  No recent treatment.  This is been ongoing for greater than a month.   Review of Systems  All other systems reviewed and are negative.  Past Medical History:  Diagnosis Date   CIN I (cervical intraepithelial neoplasia I)    IBS (irritable bowel syndrome)     Past Surgical History:  Procedure Laterality Date   COLPOSCOPY       Current Outpatient Medications:    amphetamine-dextroamphetamine (ADDERALL XR) 30 MG 24 hr capsule, Take 30 mg by mouth daily., Disp: , Rfl: 0   etonogestrel-ethinyl estradiol (NUVARING) 0.12-0.015 MG/24HR vaginal ring, Place 1 each vaginally every 28 (twenty-eight) days. Insert vaginally and leave in place for 3 consecutive weeks, then remove for 1 week., Disp: 3 each, Rfl: 3   Probiotic Product (ALIGN PO), Take 1 tablet by mouth daily. , Disp: , Rfl:   No Known Allergies         Objective:  Physical Exam  General: AAO x3, NAD  Dermatological: There is blue, purple discoloration noted to the digits most on the right second and third toes.  There is no open lesions or gangrenous changes/ulcerations at this time.  Vascular: Dorsalis Pedis artery and Posterior Tibial artery pedal pulses are 2/4 bilateral with immedate capillary fill time.  There is no pain with calf compression, swelling, warmth, erythema.   Neruologic: Grossly intact via light touch bilateral.  Musculoskeletal: There is no pain on  exam.  There is mild edema to the right second toe.  Flexor, extensor tendons are intact.  Gait: Unassisted, Nonantalgic.       Assessment:    Toe swelling, discoloration likely from Raynaud's     Plan:  -Treatment options discussed including all alternatives, risks, and complications -Etiology of symptoms were discussed -X-rays were obtained and reviewed with the patient.  3 views right foot were obtained.  No evidence of acute fracture or stress fracture. -I do think her symptoms are from Raynaud's.  Discussed different treatment options.  She was to hold off on oral medication.  Will order compound cream through [facility] to help with circulation.  Vivi Barrack DPM

## 2022-09-06 NOTE — Patient Instructions (Signed)
I have ordered a medication for you that will come from [facility] in . They should be calling you to verify insurance and will mail the medication to you. If you live close by then you can go by their pharmacy to pick up the medication. Their phone number is [phone]. If you do not hear from them in the next few days, please give us a call at [phone].   

## 2022-09-12 ENCOUNTER — Ambulatory Visit (INDEPENDENT_AMBULATORY_CARE_PROVIDER_SITE_OTHER): Payer: BC Managed Care – PPO | Admitting: *Deleted

## 2022-09-12 DIAGNOSIS — M778 Other enthesopathies, not elsewhere classified: Secondary | ICD-10-CM

## 2022-09-12 DIAGNOSIS — M21611 Bunion of right foot: Secondary | ICD-10-CM | POA: Diagnosis not present

## 2022-09-12 DIAGNOSIS — M722 Plantar fascial fibromatosis: Secondary | ICD-10-CM | POA: Diagnosis not present

## 2022-09-14 NOTE — Progress Notes (Unsigned)
Patient presents today to be casted for custom molded orthotics. Dr. Ardelle Anton. Impression foam cast was taken. ABN signed.  Patient info-  Shoe size: 75M  Shoe style: DRESS  Weight: 155 LBS  Insurance: BCBS   Patient will be notified once orthotics arrive in office and reappoint for fitting at that time.

## 2022-10-12 ENCOUNTER — Other Ambulatory Visit: Payer: Self-pay | Admitting: Nurse Practitioner

## 2022-10-12 DIAGNOSIS — Z3044 Encounter for surveillance of vaginal ring hormonal contraceptive device: Secondary | ICD-10-CM

## 2022-10-12 NOTE — Telephone Encounter (Signed)
RF request received for nuvaring.  Patient has AEX scheduled 11/27/22.  RF sent.

## 2022-10-24 ENCOUNTER — Other Ambulatory Visit: Payer: BC Managed Care – PPO

## 2022-10-30 ENCOUNTER — Ambulatory Visit: Payer: BC Managed Care – PPO | Admitting: Podiatry

## 2022-10-30 DIAGNOSIS — L539 Erythematous condition, unspecified: Secondary | ICD-10-CM

## 2022-10-30 DIAGNOSIS — M722 Plantar fascial fibromatosis: Secondary | ICD-10-CM

## 2022-10-30 DIAGNOSIS — M21619 Bunion of unspecified foot: Secondary | ICD-10-CM

## 2022-10-30 NOTE — Progress Notes (Addendum)
Subjective: Chief Complaint  Patient presents with   Foot Problem    Toes are still red, has raynauds disease, patient said cream is ineffective but using everyday   42 year old female presents the office with above concerns.  She says that both of her third toes are red.  She has not seen any drainage or pus.  She not sure based on the surface ingrown toenail.  No open lesions.  No fevers or chills.  Also is concerned about limb length discrepancy she has a left about half a centimeter on the right side.  Objective: AAO x3, NAD DP/PT pulses palpable bilaterally, CRT less than 3 seconds Localized erythema at the distal aspect of bilateral third digits.  There is no significant incurvation of the nail there is no drainage or pus.  There is no open lesions.  There is no fluctuation or crepitation there is no malodor. Bunion present No significant pain to the plantar fascia today, she does have a history of PF.  No pain with calf compression, swelling, warmth, erythema  Assessment: Raynaud's, localized erythema  Plan: -All treatment options discussed with the patient including all alternatives, risks, complications.  -I did sharply debrided nails without any complications or bleeding.  Does not have significant incurvation there is no drainage or pus.  Could be coming from the toenail versus my nails.  She can continue the creams for great hallux. Recommend Epsom salt soaks, antibiotic ointment dressing changes daily.  If no improvement consider oral antibiotics. -Will add a 0.5 cm left on the right side to the orthotic therapy made if possible. She also has bunions and a history of plantar fasciitis so I do think orthotics will help with her multiple foot issues.  -Patient encouraged to call the office with any questions, concerns, change in symptoms.   Trula Slade DPM

## 2022-11-07 IMAGING — MG MM DIGITAL SCREENING BILAT W/ TOMO AND CAD
8 series · 9 of 24 positions shown · non-contrast
Comparison: Previous exam(s).

CLINICAL DATA: Screening.

EXAM:
DIGITAL SCREENING BILATERAL MAMMOGRAM WITH TOMOSYNTHESIS AND CAD
TECHNIQUE: Bilateral screening digital craniocaudal and mediolateral oblique
mammograms were obtained. Bilateral screening digital breast
tomosynthesis was performed. The images were evaluated with
computer-aided detection.

[L MLO synth-2D]
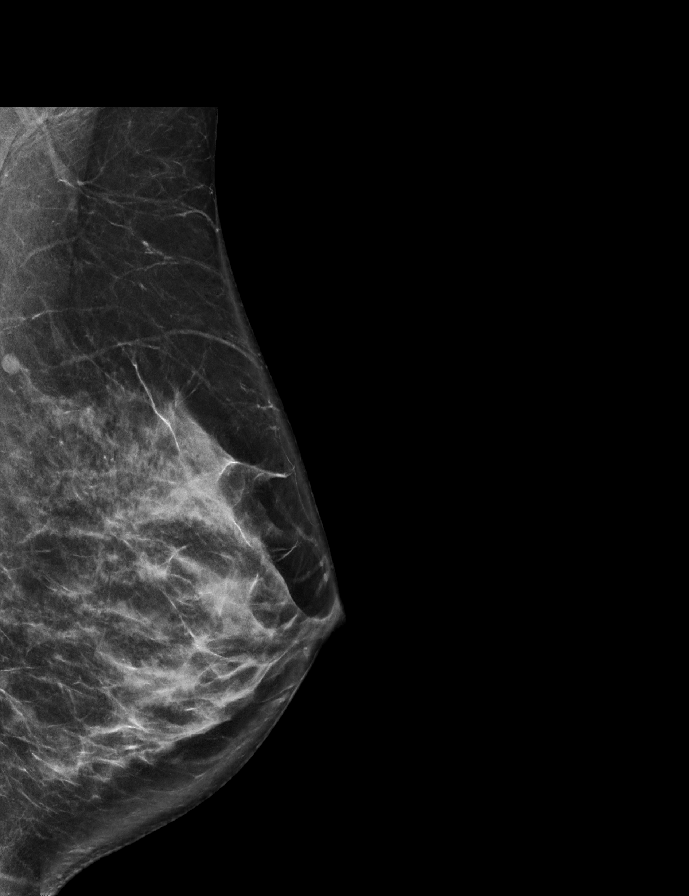

[R MLO synth-2D]
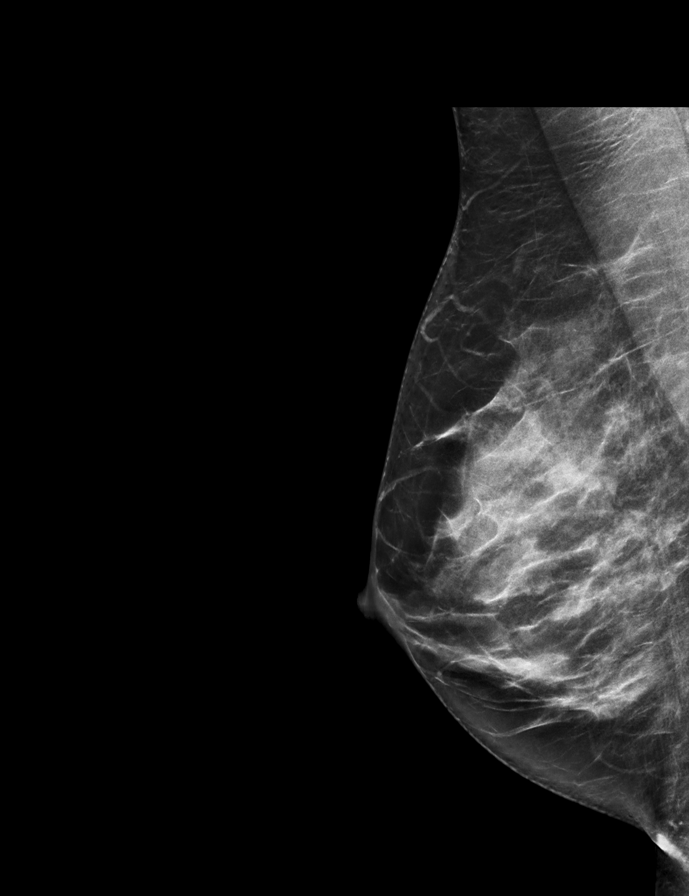

[L CC synth-2D]
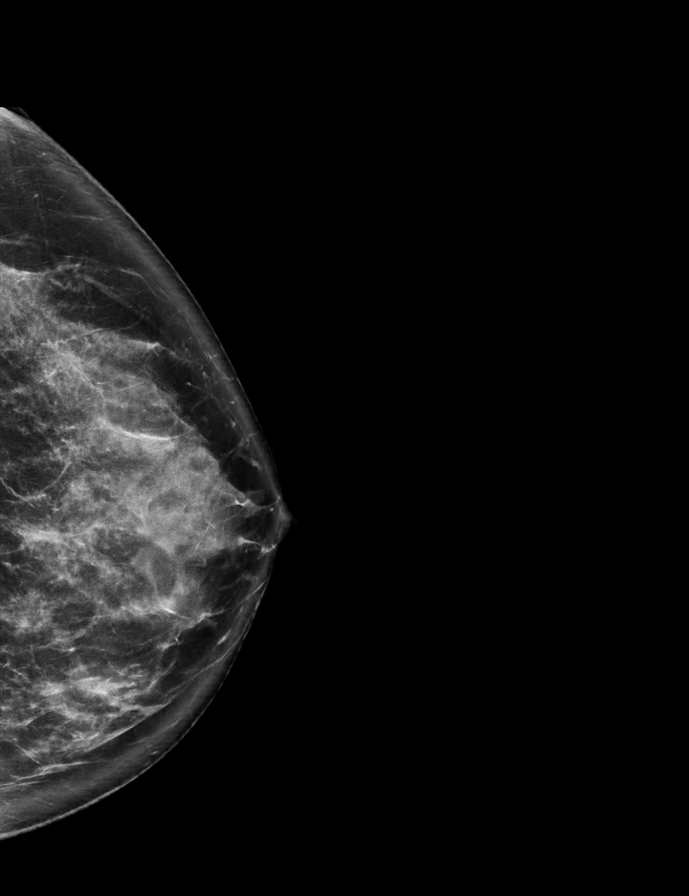

[R CC synth-2D]
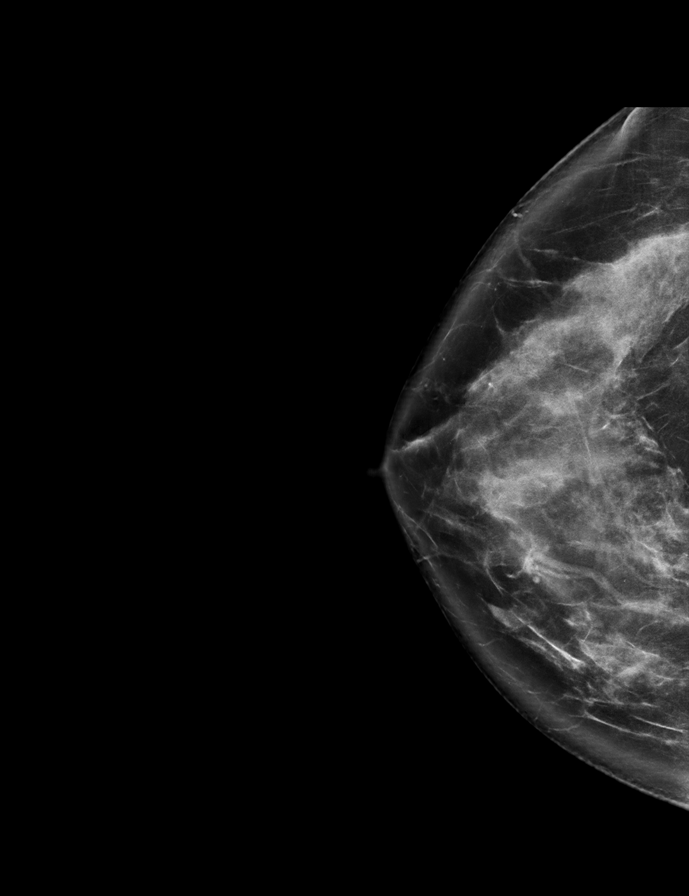

[R MLO tomo · 2 of 56 frames shown]
[frame 19/56]
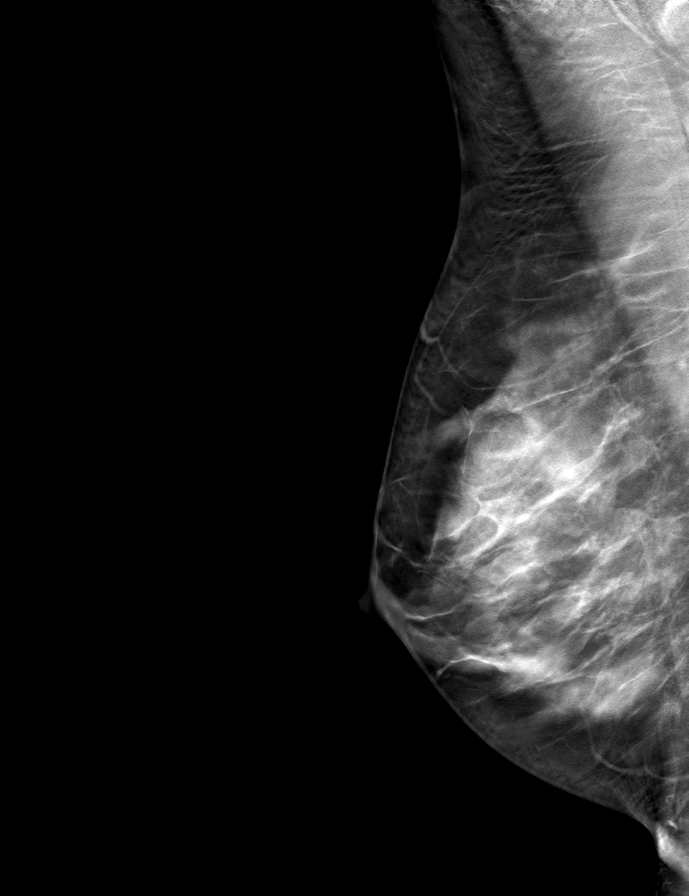
[frame 29/56]
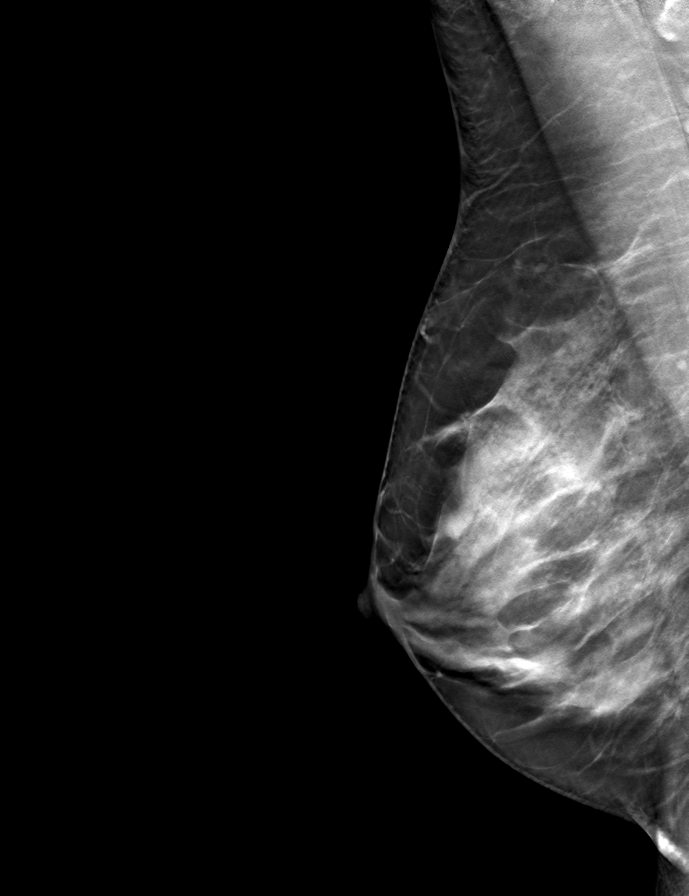

[L CC tomo · tomo slice 38/75.0]
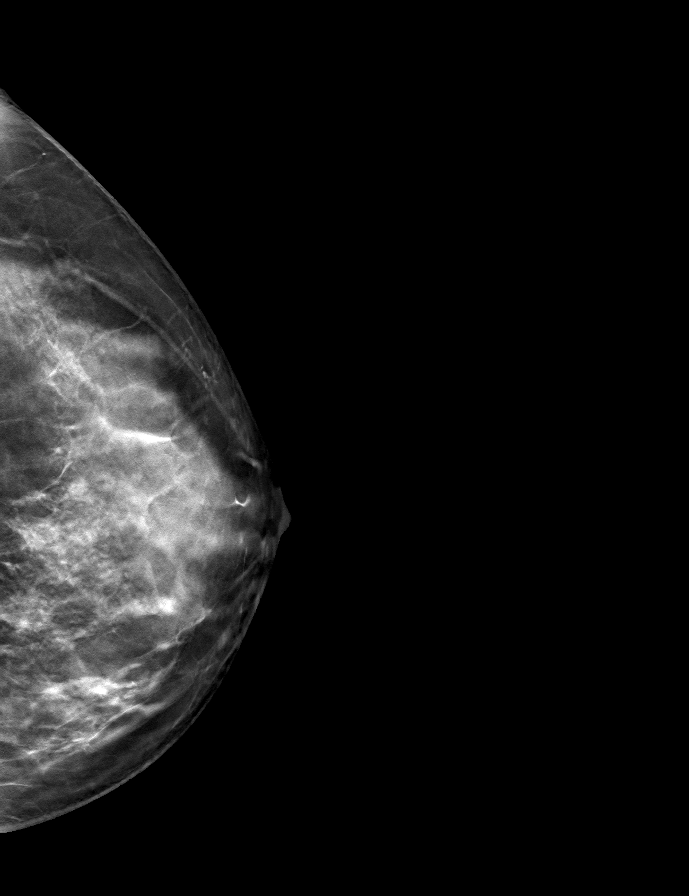

[R CC tomo · tomo slice 41/82.0]
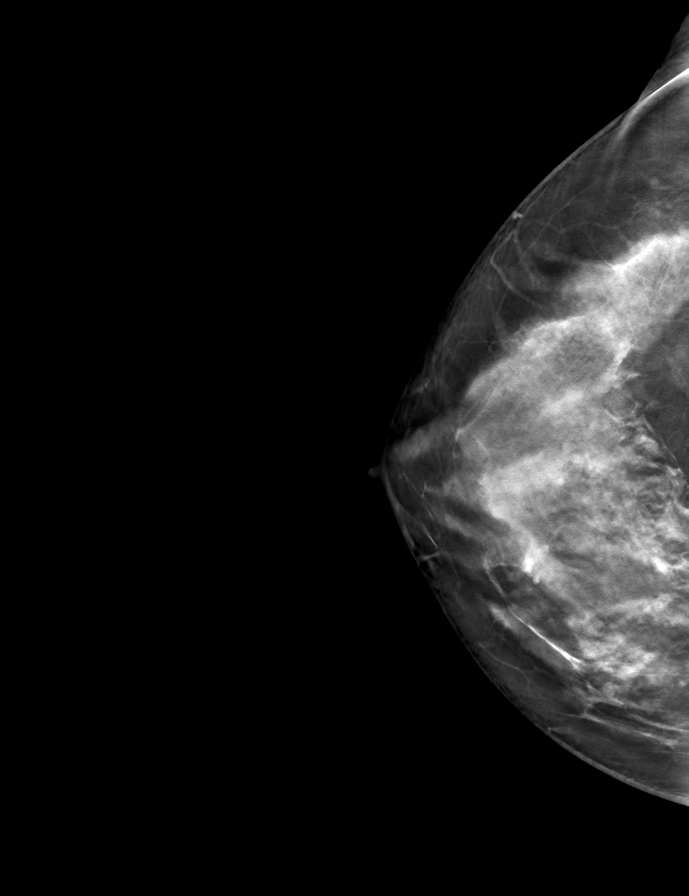

[L MLO tomo · tomo slice 37/74.0]
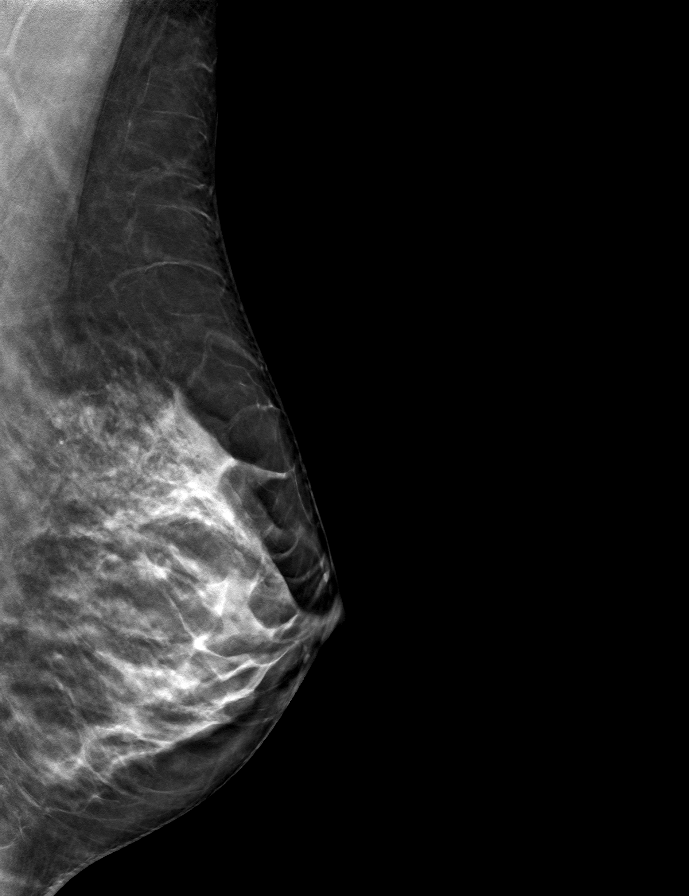

[9 of 24 positions shown; findings below may reference images not displayed]

ACR Breast Density Category d: The breast tissue is extremely dense,
which lowers the sensitivity of mammography
FINDINGS: There are no findings suspicious for malignancy. The images were
evaluated with computer-aided detection.
IMPRESSION: No mammographic evidence of malignancy. A result letter of this
screening mammogram will be mailed directly to the patient.

RECOMMENDATION:
Screening mammogram in one year. (Code:95-0-E9V)

BI-RADS CATEGORY  1: Negative.

## 2022-11-21 DIAGNOSIS — Z79899 Other long term (current) drug therapy: Secondary | ICD-10-CM | POA: Diagnosis not present

## 2022-11-21 DIAGNOSIS — Z5181 Encounter for therapeutic drug level monitoring: Secondary | ICD-10-CM | POA: Diagnosis not present

## 2022-11-21 DIAGNOSIS — Z79891 Long term (current) use of opiate analgesic: Secondary | ICD-10-CM | POA: Diagnosis not present

## 2022-11-21 DIAGNOSIS — F902 Attention-deficit hyperactivity disorder, combined type: Secondary | ICD-10-CM | POA: Diagnosis not present

## 2022-11-27 ENCOUNTER — Ambulatory Visit: Payer: BC Managed Care – PPO | Admitting: Nurse Practitioner

## 2022-11-27 NOTE — Progress Notes (Deleted)
   Angel Casey 14-Aug-1981 [id number]   History:  42 y.o. G0 presents for annual exam without GYN complaints. Monthly cycle/Nuvaring. 2005 CIN-1, subsequent paps normal. Normal mammogram history.   Gynecologic History No LMP recorded.   Contraception/Family planning: NuvaRing vaginal inserts Sexually active: Yes  Health Maintenance Last Pap: 11/11/2018. Results were: Normal neg HPV, 5-year repeat Last mammogram: 05/01/2022. Results were: Normal Last colonoscopy: Not indicated Last Dexa: Not indicated  Past medical history, past surgical history, family history and social history were all reviewed and documented in the EPIC chart. 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   ROS:  A ROS was performed and pertinent positives and negatives are included.  Exam:  There were no vitals filed for this visit.   There is no height or weight on file to calculate BMI.  General appearance:  Normal Thyroid:  Symmetrical, normal in size, without palpable masses or nodularity. Respiratory  Auscultation:  Clear without wheezing or rhonchi Cardiovascular  Auscultation:  Regular rate, without rubs, murmurs or gallops  Edema/varicosities:  Not grossly evident Abdominal  Soft,nontender, without masses, guarding or rebound.  Liver/spleen:  No organomegaly noted  Hernia:  None appreciated  Skin  Inspection:  Grossly normal   Breasts: Examined lying and sitting.   Right: Without masses, retractions, discharge or axillary adenopathy.   Left: Without masses, retractions, discharge or axillary adenopathy. Genitourinary   Inguinal/mons:  Normal without inguinal adenopathy  External genitalia:  Normal appearing vulva with no masses, tenderness, or lesions  BUS/Urethra/Skene's glands:  Normal  Vagina:  Normal appearing with normal color and discharge, no lesions  Cervix:  Normal appearing without discharge or lesions  Uterus:  Normal in size, shape and contour.  Midline and mobile,  nontender  Adnexa/parametria:     Rt: Normal in size, without masses or tenderness.   Lt: Normal in size, without masses or tenderness.  Anus and perineum: Normal  Digital rectal exam: Normal sphincter tone without palpated masses or tenderness  Patient informed chaperone available to be present for breast and pelvic exam. Patient has requested no chaperone to be present. Patient has been advised what will be completed during breast and pelvic exam.   Assessment/Plan:  42 y.o. G0 for annual exam.   Well female exam with routine gynecological exam - Education provided on SBEs, importance of preventative screenings, current guidelines, high calcium diet, regular exercise, and multivitamin daily. Labs with PCP in November.   Encounter for surveillance of vaginal ring hormonal contraceptive device - Plan: etonogestrel-ethinyl estradiol (NUVARING) 0.12-0.015 MG/24HR vaginal ring. She is using as prescribed. Refill x 1 year provided.   Screening for cervical cancer - 2005 CIN-1, subsequent paps normal. Will repeat at 5-year interval per guidelines.   Screening for breast cancer - Normal mammogram history.  Continue annual screenings.  Normal breast exam today.  Follow up in 1 year for annual.      [place], 1:43 PM 11/27/2022

## 2022-11-28 ENCOUNTER — Telehealth: Payer: Self-pay | Admitting: Podiatry

## 2022-11-28 NOTE — Telephone Encounter (Signed)
Lmom for patient to call back and set up appt to pick up orthotics   Balance 183.00

## 2022-12-10 NOTE — Telephone Encounter (Signed)
Patient was told by insurance company told her if you add on PF and bunions to the dx they would cover them?

## 2022-12-24 ENCOUNTER — Other Ambulatory Visit: Payer: BC Managed Care – PPO

## 2022-12-26 ENCOUNTER — Encounter: Payer: Self-pay | Admitting: Nurse Practitioner

## 2022-12-26 ENCOUNTER — Ambulatory Visit (INDEPENDENT_AMBULATORY_CARE_PROVIDER_SITE_OTHER): Payer: BC Managed Care – PPO | Admitting: Podiatry

## 2022-12-26 DIAGNOSIS — M21619 Bunion of unspecified foot: Secondary | ICD-10-CM

## 2022-12-26 DIAGNOSIS — M722 Plantar fascial fibromatosis: Secondary | ICD-10-CM

## 2022-12-26 DIAGNOSIS — M778 Other enthesopathies, not elsewhere classified: Secondary | ICD-10-CM

## 2022-12-26 NOTE — Progress Notes (Signed)
.  Patient presents to the office today to pick up custom inserts. Patient states that they are too hard. The heel lift also was not added.  Will send orthotics to lab for adjustments.  Advised patient that she will receive a call from the office to come in for a fitting.

## 2023-01-01 ENCOUNTER — Ambulatory Visit: Payer: BC Managed Care – PPO | Admitting: Nurse Practitioner

## 2023-01-01 NOTE — Progress Notes (Deleted)
   Angel Casey 08-29-1981 [id number]   History:  42 y.o. G0 presents for annual exam without GYN complaints. Monthly cycle/Nuvaring. 2005 CIN-1, subsequent paps normal.   Gynecologic History Patient's last menstrual period was 11/08/2021. Period Cycle (Days): 28 Period Duration (Days): 5 Period Pattern: Regular Menstrual Flow: Moderate Dysmenorrhea: (!) Mild Dysmenorrhea Symptoms: Cramping Contraception/Family planning: NuvaRing vaginal inserts Sexually active: Yes  Health Maintenance Last Pap: 11/11/2018. Results were: Normal neg HPV, 5-year repeat Last mammogram: 05/01/2022. Results were: Normal Last colonoscopy: Not indicated Last Dexa: Not indicated  Past medical history, past surgical history, family history and social history were all reviewed and documented in the EPIC chart. 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   ROS:  A ROS was performed and pertinent positives and negatives are included.  Exam:  Vitals:   11/22/21 1012  BP: 124/84  Weight: 159 lb (72.1 kg)  Height: 5' 4.5" (1.638 m)    Body mass index is 26.87 kg/m.  General appearance:  Normal Thyroid:  Symmetrical, normal in size, without palpable masses or nodularity. Respiratory  Auscultation:  Clear without wheezing or rhonchi Cardiovascular  Auscultation:  Regular rate, without rubs, murmurs or gallops  Edema/varicosities:  Not grossly evident Abdominal  Soft,nontender, without masses, guarding or rebound.  Liver/spleen:  No organomegaly noted  Hernia:  None appreciated  Skin  Inspection:  Grossly normal   Breasts: Examined lying and sitting.   Right: Without masses, retractions, discharge or axillary adenopathy.   Left: Without masses, retractions, discharge or axillary adenopathy. Genitourinary   Inguinal/mons:  Normal without inguinal adenopathy  External genitalia:  Normal appearing vulva with no masses, tenderness, or lesions  BUS/Urethra/Skene's glands:  Normal  Vagina:  Normal appearing  with normal color and discharge, no lesions  Cervix:  Normal appearing without discharge or lesions  Uterus:  Normal in size, shape and contour.  Midline and mobile, nontender  Adnexa/parametria:     Rt: Normal in size, without masses or tenderness.   Lt: Normal in size, without masses or tenderness.  Anus and perineum: Normal  Digital rectal exam: Normal sphincter tone without palpated masses or tenderness  Patient informed chaperone available to be present for breast and pelvic exam. Patient has requested no chaperone to be present. Patient has been advised what will be completed during breast and pelvic exam.   Assessment/Plan:  42 y.o. G0 for annual exam.   Well female exam with routine gynecological exam - Education provided on SBEs, importance of preventative screenings, current guidelines, high calcium diet, regular exercise, and multivitamin daily. Labs with PCP in November.   Encounter for surveillance of vaginal ring hormonal contraceptive device - Plan: etonogestrel-ethinyl estradiol (NUVARING) 0.12-0.015 MG/24HR vaginal ring. She is using as prescribed. Refill x 1 year provided.   Screening for cervical cancer - 2005 CIN-1, subsequent paps normal. Will repeat at 5-year interval per guidelines.   Screening for breast cancer - Normal mammogram history.  Continue annual screenings.  Normal breast exam today.  Follow up in 1 year for annual.      Angel Casey [facility], 10:38 AM 11/22/2021

## 2023-01-17 ENCOUNTER — Other Ambulatory Visit: Payer: BC Managed Care – PPO

## 2023-01-30 ENCOUNTER — Telehealth: Payer: Self-pay

## 2023-01-30 NOTE — Telephone Encounter (Signed)
LVM for pt to call back to pick up orthotics. OK to work in as I have her new and old orthotics.

## 2023-02-07 ENCOUNTER — Ambulatory Visit (INDEPENDENT_AMBULATORY_CARE_PROVIDER_SITE_OTHER): Payer: BC Managed Care – PPO

## 2023-02-07 DIAGNOSIS — M778 Other enthesopathies, not elsewhere classified: Secondary | ICD-10-CM

## 2023-02-07 DIAGNOSIS — M722 Plantar fascial fibromatosis: Secondary | ICD-10-CM

## 2023-02-07 NOTE — Progress Notes (Signed)
Patient presents today to pick up custom molded foot orthotics recommended by Dr. WAGONER.   Orthotics were dispensed and fit was satisfactory. Reviewed instructions for break-in and wear. Written instructions given to patient.  Patient will follow up as needed.     

## 2023-02-26 DIAGNOSIS — R030 Elevated blood-pressure reading, without diagnosis of hypertension: Secondary | ICD-10-CM | POA: Diagnosis not present

## 2023-02-26 DIAGNOSIS — R7301 Impaired fasting glucose: Secondary | ICD-10-CM | POA: Diagnosis not present

## 2023-03-05 DIAGNOSIS — Z1331 Encounter for screening for depression: Secondary | ICD-10-CM | POA: Diagnosis not present

## 2023-03-05 DIAGNOSIS — Z1339 Encounter for screening examination for other mental health and behavioral disorders: Secondary | ICD-10-CM | POA: Diagnosis not present

## 2023-03-05 DIAGNOSIS — Z0000 Encounter for general adult medical examination without abnormal findings: Secondary | ICD-10-CM | POA: Diagnosis not present

## 2023-03-05 DIAGNOSIS — R030 Elevated blood-pressure reading, without diagnosis of hypertension: Secondary | ICD-10-CM | POA: Diagnosis not present

## 2023-04-03 ENCOUNTER — Other Ambulatory Visit: Payer: Self-pay | Admitting: Internal Medicine

## 2023-04-03 DIAGNOSIS — Z1231 Encounter for screening mammogram for malignant neoplasm of breast: Secondary | ICD-10-CM

## 2023-05-06 ENCOUNTER — Other Ambulatory Visit: Payer: Self-pay | Admitting: Nurse Practitioner

## 2023-05-06 DIAGNOSIS — Z3044 Encounter for surveillance of vaginal ring hormonal contraceptive device: Secondary | ICD-10-CM

## 2023-05-06 NOTE — Telephone Encounter (Signed)
Medication refill request: Nuvaring  Last AEX:  11/22/21 Next AEX: none scheduled  Last MMG (if hormonal medication request): 05/02/22 Refill authorized: please advise.

## 2023-05-07 ENCOUNTER — Ambulatory Visit
Admission: RE | Admit: 2023-05-07 | Discharge: 2023-05-07 | Disposition: A | Discharge status: Home / Self Care | Payer: BC Managed Care – PPO | Source: Ambulatory Visit | Attending: Internal Medicine | Admitting: Internal Medicine

## 2023-05-07 DIAGNOSIS — Z1231 Encounter for screening mammogram for malignant neoplasm of breast: Secondary | ICD-10-CM

## 2023-05-09 ENCOUNTER — Other Ambulatory Visit: Payer: Self-pay | Admitting: Internal Medicine

## 2023-05-09 DIAGNOSIS — R928 Other abnormal and inconclusive findings on diagnostic imaging of breast: Secondary | ICD-10-CM

## 2023-05-23 ENCOUNTER — Ambulatory Visit
Admission: RE | Admit: 2023-05-23 | Discharge: 2023-05-23 | Disposition: A | Discharge status: Home / Self Care | Payer: BC Managed Care – PPO | Source: Ambulatory Visit | Attending: Internal Medicine | Admitting: Internal Medicine

## 2023-05-23 ENCOUNTER — Other Ambulatory Visit: Payer: Self-pay | Admitting: Internal Medicine

## 2023-05-23 DIAGNOSIS — R928 Other abnormal and inconclusive findings on diagnostic imaging of breast: Secondary | ICD-10-CM | POA: Diagnosis not present

## 2023-05-23 DIAGNOSIS — N6311 Unspecified lump in the right breast, upper outer quadrant: Secondary | ICD-10-CM | POA: Diagnosis not present

## 2023-05-23 DIAGNOSIS — N6310 Unspecified lump in the right breast, unspecified quadrant: Secondary | ICD-10-CM

## 2023-05-28 ENCOUNTER — Telehealth: Payer: Self-pay

## 2023-05-28 ENCOUNTER — Ambulatory Visit
Admission: RE | Admit: 2023-05-28 | Discharge: 2023-05-28 | Disposition: A | Discharge status: Home / Self Care | Payer: BC Managed Care – PPO | Source: Ambulatory Visit | Attending: Internal Medicine

## 2023-05-28 ENCOUNTER — Ambulatory Visit
Admission: RE | Admit: 2023-05-28 | Discharge: 2023-05-28 | Disposition: A | Discharge status: Home / Self Care | Payer: BC Managed Care – PPO | Source: Ambulatory Visit | Attending: Internal Medicine | Admitting: Internal Medicine

## 2023-05-28 DIAGNOSIS — N6011 Diffuse cystic mastopathy of right breast: Secondary | ICD-10-CM | POA: Diagnosis not present

## 2023-05-28 DIAGNOSIS — R928 Other abnormal and inconclusive findings on diagnostic imaging of breast: Secondary | ICD-10-CM

## 2023-05-28 DIAGNOSIS — N6311 Unspecified lump in the right breast, upper outer quadrant: Secondary | ICD-10-CM | POA: Diagnosis not present

## 2023-05-28 DIAGNOSIS — N6310 Unspecified lump in the right breast, unspecified quadrant: Secondary | ICD-10-CM

## 2023-05-28 DIAGNOSIS — D241 Benign neoplasm of right breast: Secondary | ICD-10-CM | POA: Diagnosis not present

## 2023-05-28 HISTORY — PX: BREAST BIOPSY: SHX20

## 2023-05-28 NOTE — Telephone Encounter (Signed)
PA started through cover my meds. Key  [id number]  Waiting on response from [facility]

## 2023-06-04 DIAGNOSIS — F902 Attention-deficit hyperactivity disorder, combined type: Secondary | ICD-10-CM | POA: Diagnosis not present

## 2023-06-04 DIAGNOSIS — Z79899 Other long term (current) drug therapy: Secondary | ICD-10-CM | POA: Diagnosis not present

## 2023-06-06 ENCOUNTER — Other Ambulatory Visit: Payer: Self-pay | Admitting: Nurse Practitioner

## 2023-06-06 DIAGNOSIS — Z3044 Encounter for surveillance of vaginal ring hormonal contraceptive device: Secondary | ICD-10-CM

## 2023-06-06 NOTE — Telephone Encounter (Signed)
Med refill request: Nuvaring Last AEX: 11/22/21 Next AEX: 09/05/23 Last MMG (if hormonal med) 05/02/22 Refill authorized: Please Advise, #1, 0 RF, waiting for PA

## 2023-07-11 ENCOUNTER — Telehealth: Payer: Self-pay

## 2023-07-11 NOTE — Telephone Encounter (Signed)
Initiated a PA via [employer]

## 2023-07-15 NOTE — Telephone Encounter (Signed)
Yes, please.

## 2023-07-15 NOTE — Telephone Encounter (Signed)
PA request denied due to medication not being on pt's formulary and is only covered when the member failed the brand name product due to an ingredient that is not present in the generic. Also, the member must try and fail, or be unable to take ALL formulary alternatives.  Formulary alternatives include: Brand Nuvaring, Generic Mircette, Generic Ortho Tri-Cyclen Lo, Generic Loestrin Fe.  Ok to contact pt's pharmacy and see if they are able to dispense to pt the brand name?

## 2023-07-16 NOTE — Telephone Encounter (Signed)
[facility] contacted and they reported that they already dispensed it as the brand for the pt.   Archived PA response on CoverMyMeds.  Routing to provider for final review and closing encounter.

## 2023-08-06 ENCOUNTER — Other Ambulatory Visit: Payer: Self-pay | Admitting: Nurse Practitioner

## 2023-08-06 DIAGNOSIS — Z3044 Encounter for surveillance of vaginal ring hormonal contraceptive device: Secondary | ICD-10-CM

## 2023-08-07 NOTE — Telephone Encounter (Signed)
Medication refill request: Nuvaring  Last AEX:  11/22/21 Next AEX: 09/05/23 Last MMG (if hormonal medication request): n/a Refill authorized: #1 no rf to get her to her aex

## 2023-08-22 ENCOUNTER — Other Ambulatory Visit: Payer: Self-pay | Admitting: Nurse Practitioner

## 2023-08-22 DIAGNOSIS — Z3044 Encounter for surveillance of vaginal ring hormonal contraceptive device: Secondary | ICD-10-CM

## 2023-08-22 NOTE — Telephone Encounter (Signed)
Pt requesting three month supply of Nuvaring but has appt 09/05/23. Previously filled 08/07/23

## 2023-09-05 ENCOUNTER — Other Ambulatory Visit (HOSPITAL_COMMUNITY)
Admission: RE | Admit: 2023-09-05 | Discharge: 2023-09-05 | Disposition: A | Discharge status: Home / Self Care | Payer: BC Managed Care – PPO | Source: Ambulatory Visit | Attending: Nurse Practitioner | Admitting: Nurse Practitioner

## 2023-09-05 ENCOUNTER — Ambulatory Visit (INDEPENDENT_AMBULATORY_CARE_PROVIDER_SITE_OTHER): Payer: BC Managed Care – PPO | Admitting: Nurse Practitioner

## 2023-09-05 VITALS — BP 126/78 | HR 64 | Ht 64.0 in | Wt 159.0 lb

## 2023-09-05 DIAGNOSIS — Z3044 Encounter for surveillance of vaginal ring hormonal contraceptive device: Secondary | ICD-10-CM

## 2023-09-05 DIAGNOSIS — I499 Cardiac arrhythmia, unspecified: Secondary | ICD-10-CM

## 2023-09-05 DIAGNOSIS — Z124 Encounter for screening for malignant neoplasm of cervix: Secondary | ICD-10-CM | POA: Diagnosis not present

## 2023-09-05 DIAGNOSIS — Z01419 Encounter for gynecological examination (general) (routine) without abnormal findings: Secondary | ICD-10-CM | POA: Diagnosis not present

## 2023-09-05 DIAGNOSIS — R6889 Other general symptoms and signs: Secondary | ICD-10-CM | POA: Diagnosis not present

## 2023-09-05 DIAGNOSIS — R5383 Other fatigue: Secondary | ICD-10-CM | POA: Diagnosis not present

## 2023-09-05 DIAGNOSIS — E785 Hyperlipidemia, unspecified: Secondary | ICD-10-CM | POA: Diagnosis not present

## 2023-09-05 DIAGNOSIS — E559 Vitamin D deficiency, unspecified: Secondary | ICD-10-CM | POA: Diagnosis not present

## 2023-09-05 MED ORDER — ETONOGESTREL-ETHINYL ESTRADIOL 0.12-0.015 MG/24HR VA RING: VAGINAL_RING | VAGINAL | 3 refills | Status: DC

## 2023-09-05 NOTE — Progress Notes (Signed)
Angel Casey 10/28/1980 [id number]   History:  42 y.o. G0 presents for annual exam. Monthly cycle/Nuvaring. 2005 CIN-1, subsequent paps normal. Complains of always being hot and some weight gain. Has increased stress due to break up this year, work and father's health. Had slightly elevated TSH a couple of years ago, normal when rechecked.   Gynecologic History Patient's last menstrual period was 08/22/2023 (approximate). Period Cycle (Days): 28 Period Duration (Days): 5 Period Pattern: Regular Menstrual Flow: Moderate Menstrual Control: Tampon Menstrual Control Change Freq (Hours): 6-8 Dysmenorrhea: (!) Mild Dysmenorrhea Symptoms: Cramping Contraception/Family planning: NuvaRing vaginal inserts Sexually active: No  Health Maintenance Last Pap: 11/11/2018. Results were: Normal neg HPV Last mammogram: 05/07/2023. Results were: Possible right breast mass, fibroadenoma confirmed by biopsy Last colonoscopy: Not indicated Last Dexa: Not indicated  Past medical history, past surgical history, family history and social history were all reviewed and documented in the EPIC chart. Single. Works in Research officer, political party.   ROS:  A ROS was performed and pertinent positives and negatives are included.  Exam:  Vitals:   09/05/23 1012  BP: 126/78  Pulse: 64  SpO2: 100%  Weight: 159 lb (72.1 kg)  Height: 5\' 4"  (1.626 m)     Body mass index is 27.29 kg/m.  General appearance:  Normal Thyroid:  Symmetrical, normal in size, without palpable masses or nodularity. Respiratory  Auscultation:  Clear without wheezing or rhonchi Cardiovascular  Auscultation:  Initial listen it was regularly irregular, repeat at end of visit regular. without rubs, murmurs or gallops  Edema/varicosities:  Not grossly evident Abdominal  Soft,nontender, without masses, guarding or rebound.  Liver/spleen:  No organomegaly noted  Hernia:  None appreciated  Skin  Inspection:  Grossly normal   Breasts: Examined  lying and sitting.   Right: Without masses, retractions, discharge or axillary adenopathy.   Left: Without masses, retractions, discharge or axillary adenopathy. Pelvic: External genitalia:  no lesions              Urethra:  normal appearing urethra with no masses, tenderness or lesions              Bartholins and Skenes: normal                 Vagina: normal appearing vagina with normal color and discharge, no lesions              Cervix: no lesions Bimanual Exam:  Uterus:  no masses or tenderness              Adnexa: no mass, fullness, tenderness              Rectovaginal: Deferred              Anus:  normal, no lesions  Patient informed chaperone available to be present for breast and pelvic exam. Patient has requested no chaperone to be present. Patient has been advised what will be completed during breast and pelvic exam.   Assessment/Plan:  42 y.o. G0 for annual exam.   Well female exam with routine gynecological exam - Plan: CBC with Differential/Platelet, Comprehensive metabolic panel. Education provided on SBEs, importance of preventative screenings, current guidelines, high calcium diet, regular exercise, and multivitamin daily. Has PCP.   Encounter for surveillance of vaginal ring hormonal contraceptive device - Plan: etonogestrel-ethinyl estradiol (NUVARING) 0.12-0.015 MG/24HR vaginal ring. She is using as prescribed. Refill x 1 year provided.   Cervical cancer screening - Plan: Cytology - PAP( [place]). 2005 CIN-1, subsequent  paps normal.  Heat intolerance - Plan: TSH  Fatigue, unspecified type - Plan: TSH, VITAMIN D 25 Hydroxy (Vit-D Deficiency, Fractures), CBC with Differential/Platelet, Comprehensive metabolic panel  Irregular heart beat - Plan: CBC with Differential/Platelet, Comprehensive metabolic panel, TSH. Will follow up with PCP. At beginning of visit it was regularly irregular, then at end of visit it was regular. Does report occasional palpitations without  shortness of breath, chest pain or lightheadedness. She feels it is anxiety related.   Screening for breast cancer - 10/2022 biopsy confirmed fibroadenoma. Normal breast exam today.  Return in about 1 year (around 09/04/2024) for Annual.     [name] [facility], 10:55 AM 09/05/2023

## 2023-09-06 LAB — CBC WITH DIFFERENTIAL/PLATELET
Absolute Lymphocytes: 2537 {cells}/uL (ref 850–3900)
Absolute Monocytes: 815 {cells}/uL (ref 200–950)
Basophils Absolute: 84 {cells}/uL (ref 0–200)
Basophils Relative: 1 %
Eosinophils Absolute: 101 {cells}/uL (ref 15–500)
Eosinophils Relative: 1.2 %
HCT: 40.2 % (ref 35.0–45.0)
Hemoglobin: 13.4 g/dL (ref 11.7–15.5)
MCH: 31.6 pg (ref 27.0–33.0)
MCHC: 33.3 g/dL (ref 32.0–36.0)
MCV: 94.8 fL (ref 80.0–100.0)
MPV: 10.7 fL (ref 7.5–12.5)
Monocytes Relative: 9.7 %
Neutro Abs: 4864 {cells}/uL (ref 1500–7800)
Neutrophils Relative %: 57.9 %
Platelets: 358 10*3/uL (ref 140–400)
RBC: 4.24 10*6/uL (ref 3.80–5.10)
RDW: 11.4 % (ref 11.0–15.0)
Total Lymphocyte: 30.2 %
WBC: 8.4 10*3/uL (ref 3.8–10.8)

## 2023-09-06 LAB — CYTOLOGY - PAP
Adequacy: ABSENT
Comment: NEGATIVE
Diagnosis: NEGATIVE
High risk HPV: NEGATIVE

## 2023-09-06 LAB — COMPREHENSIVE METABOLIC PANEL
AG Ratio: 1.6 (calc) (ref 1.0–2.5)
ALT: 16 U/L (ref 6–29)
AST: 15 U/L (ref 10–30)
Albumin: 4.4 g/dL (ref 3.6–5.1)
Alkaline phosphatase (APISO): 40 U/L (ref 31–125)
BUN: 10 mg/dL (ref 7–25)
CO2: 24 mmol/L (ref 20–32)
Calcium: 9.6 mg/dL (ref 8.6–10.2)
Chloride: 104 mmol/L (ref 98–110)
Creat: 0.72 mg/dL (ref 0.50–0.99)
Globulin: 2.8 g/dL (ref 1.9–3.7)
Glucose, Bld: 97 mg/dL (ref 65–99)
Potassium: 4.3 mmol/L (ref 3.5–5.3)
Sodium: 137 mmol/L (ref 135–146)
Total Bilirubin: 0.6 mg/dL (ref 0.2–1.2)
Total Protein: 7.2 g/dL (ref 6.1–8.1)

## 2023-09-06 LAB — VITAMIN D 25 HYDROXY (VIT D DEFICIENCY, FRACTURES): Vit D, 25-Hydroxy: 18 ng/mL — ABNORMAL LOW (ref 30–100)

## 2023-09-06 LAB — TSH: TSH: 2.98 m[IU]/L

## 2023-09-09 ENCOUNTER — Other Ambulatory Visit: Payer: Self-pay | Admitting: Nurse Practitioner

## 2023-09-09 DIAGNOSIS — E559 Vitamin D deficiency, unspecified: Secondary | ICD-10-CM

## 2023-09-09 MED ORDER — VITAMIN D (ERGOCALCIFEROL) 1.25 MG (50000 UNIT) PO CAPS: 50000.0000 [IU] | ORAL_CAPSULE | ORAL | 0 refills | Status: AC

## 2023-09-10 DIAGNOSIS — R638 Other symptoms and signs concerning food and fluid intake: Secondary | ICD-10-CM | POA: Diagnosis not present

## 2023-09-10 DIAGNOSIS — J029 Acute pharyngitis, unspecified: Secondary | ICD-10-CM | POA: Diagnosis not present

## 2023-09-10 DIAGNOSIS — J069 Acute upper respiratory infection, unspecified: Secondary | ICD-10-CM | POA: Diagnosis not present

## 2023-11-19 ENCOUNTER — Other Ambulatory Visit: Payer: Self-pay

## 2023-11-19 DIAGNOSIS — Z3044 Encounter for surveillance of vaginal ring hormonal contraceptive device: Secondary | ICD-10-CM

## 2023-11-19 MED ORDER — ETONOGESTREL-ETHINYL ESTRADIOL 0.12-0.015 MG/24HR VA RING: VAGINAL_RING | VAGINAL | 2 refills | Status: AC

## 2023-11-19 NOTE — Telephone Encounter (Signed)
 Med refill request: Ring  Last AEX: 09/05/2023-TW Next AEX: 09/14/2024 Last MMG (if hormonal med): 04/2023-inconclusive, Dx imaging & f/u- WNL Refill authorized: rx pend.

## 2023-11-20 ENCOUNTER — Telehealth: Payer: Self-pay

## 2023-11-20 NOTE — Telephone Encounter (Signed)
 Prior auth for [employer] submitting to insurance  Covermy meds key [id number]

## 2023-11-21 NOTE — Telephone Encounter (Signed)
 Per CoverMyMeds, PA has not been sent.   PA inquiring as to why the pt could not take the brand name?   Advised TW and inquired if she could. TW confirmed yes.   Will contact the pharmacy to see if able to dispense the brand and if they could try submitting that with the pt's insurance.   Spoke w/ pharmacy and they reported that brand was ran through insurance and covered 100%, pt just needs to pay copay before it is mailed. In which copay is $0 dollars so will advise pt to call and confirm delivery just in case.   Pt notified via detailed VM per DPR. Encounter closed.

## 2023-12-03 DIAGNOSIS — Z79899 Other long term (current) drug therapy: Secondary | ICD-10-CM | POA: Diagnosis not present

## 2023-12-03 DIAGNOSIS — F902 Attention-deficit hyperactivity disorder, combined type: Secondary | ICD-10-CM | POA: Diagnosis not present

## 2023-12-03 DIAGNOSIS — I7300 Raynaud's syndrome without gangrene: Secondary | ICD-10-CM | POA: Diagnosis not present

## 2023-12-17 DIAGNOSIS — E559 Vitamin D deficiency, unspecified: Secondary | ICD-10-CM | POA: Diagnosis not present

## 2023-12-17 DIAGNOSIS — N951 Menopausal and female climacteric states: Secondary | ICD-10-CM | POA: Diagnosis not present

## 2023-12-17 DIAGNOSIS — E7849 Other hyperlipidemia: Secondary | ICD-10-CM | POA: Diagnosis not present

## 2023-12-17 DIAGNOSIS — Z131 Encounter for screening for diabetes mellitus: Secondary | ICD-10-CM | POA: Diagnosis not present

## 2023-12-17 DIAGNOSIS — R635 Abnormal weight gain: Secondary | ICD-10-CM | POA: Diagnosis not present

## 2023-12-17 DIAGNOSIS — R7989 Other specified abnormal findings of blood chemistry: Secondary | ICD-10-CM | POA: Diagnosis not present

## 2023-12-24 DIAGNOSIS — Z1331 Encounter for screening for depression: Secondary | ICD-10-CM | POA: Diagnosis not present

## 2023-12-24 DIAGNOSIS — E663 Overweight: Secondary | ICD-10-CM | POA: Diagnosis not present

## 2023-12-24 DIAGNOSIS — L700 Acne vulgaris: Secondary | ICD-10-CM | POA: Diagnosis not present

## 2023-12-24 DIAGNOSIS — N951 Menopausal and female climacteric states: Secondary | ICD-10-CM | POA: Diagnosis not present

## 2023-12-24 DIAGNOSIS — E7849 Other hyperlipidemia: Secondary | ICD-10-CM | POA: Diagnosis not present

## 2024-01-07 DIAGNOSIS — Z6827 Body mass index (BMI) 27.0-27.9, adult: Secondary | ICD-10-CM | POA: Diagnosis not present

## 2024-01-07 DIAGNOSIS — E7849 Other hyperlipidemia: Secondary | ICD-10-CM | POA: Diagnosis not present

## 2024-01-15 DIAGNOSIS — Z6827 Body mass index (BMI) 27.0-27.9, adult: Secondary | ICD-10-CM | POA: Diagnosis not present

## 2024-01-15 DIAGNOSIS — E7849 Other hyperlipidemia: Secondary | ICD-10-CM | POA: Diagnosis not present

## 2024-04-23 ENCOUNTER — Telehealth: Payer: Self-pay

## 2024-04-23 NOTE — Telephone Encounter (Signed)
 Patient states she has a UTI. She has frequent urination & cloudy urine. She states she took azo & it helped some but she still has these symptoms. Patient usually sees Tiffany,NP but only appt for tomorrow is with Jami, NP at 2pm. Patient is scheduled for that appt. Patient wanted to ask Tiffany if she could [facility] & she cancel the appt for tomorrow. She states if she doesn't get better that she would schedule an appointment for next week. Patient offered an appointment for today but she states she could not come. I told her we prefer she come in & be seen but she wanted me to ask anyway. Please advise.

## 2024-04-23 NOTE — Telephone Encounter (Signed)
 Agree with coming in to obtain urine culture. Recommend keeping appt for tomorrow. Can continue Azo until then if needed.

## 2024-04-23 NOTE — Telephone Encounter (Signed)
 Patient will keep scheduled appt for tomorrow with [name], NP since no appt slots with Tiffany.

## 2024-04-24 ENCOUNTER — Encounter: Payer: Self-pay | Admitting: Radiology

## 2024-04-24 ENCOUNTER — Ambulatory Visit: Admitting: Radiology

## 2024-04-24 VITALS — BP 136/88 | HR 81 | Temp 98.3°F | Wt 159.0 lb

## 2024-04-24 DIAGNOSIS — R300 Dysuria: Secondary | ICD-10-CM | POA: Diagnosis not present

## 2024-04-24 DIAGNOSIS — Z113 Encounter for screening for infections with a predominantly sexual mode of transmission: Secondary | ICD-10-CM | POA: Diagnosis not present

## 2024-04-24 DIAGNOSIS — N898 Other specified noninflammatory disorders of vagina: Secondary | ICD-10-CM

## 2024-04-24 LAB — WET PREP FOR TRICH, YEAST, CLUE

## 2024-04-24 NOTE — Progress Notes (Signed)
      Subjective: Angel Casey is a 43 y.o. female who complains of urinary urgency, cloudy urine, odor in urine, vaginal odor, no abnormal vaginal discharge. Symptoms began 5 days ago. Has a new sexual partner.     Review of Systems  All other systems reviewed and are negative.   Past Medical History:  Diagnosis Date   CIN I (cervical intraepithelial neoplasia I)    IBS (irritable bowel syndrome)       Objective:  Today's Vitals   04/24/24 1403  BP: 136/88  Pulse: 81  Temp: 98.3 F (36.8 C)  TempSrc: Oral  SpO2: 99%  Weight: 159 lb (72.1 kg)   Body mass index is 27.29 kg/m.   Physical Exam Vitals and nursing note reviewed. Exam conducted with a chaperone present.  Constitutional:      Appearance: Normal appearance. She is well-developed.  Pulmonary:     Effort: Pulmonary effort is normal.  Abdominal:     General: Abdomen is flat.     Palpations: Abdomen is soft.  Genitourinary:    General: Normal vulva.     Vagina: Vaginal discharge present. No erythema, bleeding or lesions.     Cervix: Normal. No discharge, friability, lesion or erythema.     Uterus: Normal.      Adnexa: Right adnexa normal and left adnexa normal.  Neurological:     Mental Status: She is alert.  Psychiatric:        Mood and Affect: Mood normal.        Thought Content: Thought content normal.        Judgment: Judgment normal.    Urine dipstick shows positive for RBC's and positive for ketones.  Micro exam: 0-2 WBC's per HPF, 0-2 RBC's per HPF, and few+ bacteria.   Microscopic wet-mount exam shows negative for pathogens, normal epithelial cells.   Darice Hoit, CMA present for exam  Assessment:/Plan:   1. Dysuria (Primary) - Urinalysis,Complete w/RFL Culture  2. Vaginal odor - WET PREP FOR TRICH, YEAST, CLUE  3. Screen for sexually transmitted diseases - SURESWAB CT/NG/T. vaginalis    Will contact patient with results of testing completed today. Avoid intercourse until  symptoms are resolved. Safe sex encouraged. Avoid the use of soaps or perfumed products in the peri area. Avoid tub baths and sitting in sweaty or wet clothing for prolonged periods of time.   Return if symptoms worsen or fail to improve.   Darcell Sabino B, NP 2:13 PM

## 2024-04-25 LAB — SURESWAB CT/NG/T. VAGINALIS
C. trachomatis RNA, TMA: NOT DETECTED
N. gonorrhoeae RNA, TMA: NOT DETECTED
Trichomonas vaginalis RNA: NOT DETECTED

## 2024-04-26 LAB — URINALYSIS, COMPLETE W/RFL CULTURE
Bilirubin Urine: NEGATIVE
Casts: NONE SEEN /LPF
Crystals: NONE SEEN /HPF
Glucose, UA: NEGATIVE
Leukocyte Esterase: NEGATIVE
Nitrites, Initial: NEGATIVE
Protein, ur: NEGATIVE
Specific Gravity, Urine: 1.015 (ref 1.001–1.035)
Yeast: NONE SEEN /HPF
pH: 6.5 (ref 5.0–8.0)

## 2024-04-26 LAB — URINE CULTURE
MICRO NUMBER:: 16806231
Result:: NO GROWTH
SPECIMEN QUALITY:: ADEQUATE

## 2024-04-26 LAB — CULTURE INDICATED

## 2024-04-27 ENCOUNTER — Telehealth: Payer: Self-pay

## 2024-04-27 NOTE — Telephone Encounter (Signed)
 Patient called about her urine culture. I reviewed that culture was negitive. Patient states that she is still having frequency, bladder cramping and odor in urine. She would like advice on what could be going on. Nu swab and wet prep also neg.

## 2024-04-28 ENCOUNTER — Other Ambulatory Visit: Payer: Self-pay

## 2024-04-28 ENCOUNTER — Other Ambulatory Visit

## 2024-04-28 DIAGNOSIS — R300 Dysuria: Secondary | ICD-10-CM

## 2024-04-28 LAB — URINALYSIS, COMPLETE W/RFL CULTURE
Bacteria, UA: NONE SEEN /HPF
Bilirubin Urine: NEGATIVE
Glucose, UA: NEGATIVE
Hgb urine dipstick: NEGATIVE
Hyaline Cast: NONE SEEN /LPF
Ketones, ur: NEGATIVE
Leukocyte Esterase: NEGATIVE
Nitrites, Initial: NEGATIVE
Protein, ur: NEGATIVE
RBC / HPF: NONE SEEN /HPF (ref 0–2)
Specific Gravity, Urine: 1.001 (ref 1.001–1.035)
WBC, UA: NONE SEEN /HPF (ref 0–5)
pH: 6 (ref 5.0–8.0)

## 2024-04-28 LAB — NO CULTURE INDICATED

## 2024-04-28 NOTE — Telephone Encounter (Signed)
 Message left for patient to make lab appointment. Orders placed.

## 2024-04-28 NOTE — Telephone Encounter (Signed)
 May come in to leave repeat sample for urine culture. If still negative will refer to urology.

## 2024-04-29 ENCOUNTER — Ambulatory Visit: Payer: Self-pay | Admitting: Radiology

## 2024-06-08 ENCOUNTER — Other Ambulatory Visit: Payer: Self-pay | Admitting: Internal Medicine

## 2024-06-08 DIAGNOSIS — Z1231 Encounter for screening mammogram for malignant neoplasm of breast: Secondary | ICD-10-CM

## 2024-06-15 ENCOUNTER — Ambulatory Visit
Admission: RE | Admit: 2024-06-15 | Discharge: 2024-06-15 | Disposition: A | Source: Ambulatory Visit | Attending: Internal Medicine

## 2024-06-15 ENCOUNTER — Ambulatory Visit

## 2024-06-15 DIAGNOSIS — Z1231 Encounter for screening mammogram for malignant neoplasm of breast: Secondary | ICD-10-CM

## 2024-07-14 DIAGNOSIS — R9431 Abnormal electrocardiogram [ECG] [EKG]: Secondary | ICD-10-CM | POA: Insufficient documentation

## 2024-07-14 NOTE — Progress Notes (Unsigned)
  Cardiology Office Note:   Date:  07/14/2024  ID:  AHNI BRADWELL, DOB 11-02-1980, MRN [id number] PCP: Stephane Leita DEL, MD  [facility] Providers Cardiologist:  None {  History of Present Illness:   Angel Casey is a 43 y.o. female ***for evaluation of an abnormal EKG.  She had PACs and probable LAE on EKG.  ****   She was seen in 2018 by Dr. Court in 2018 for possible Raynaud's.  She was treated with clacium channel blocker.     ***    ROS: ***  Studies Reviewed:    EKG:       ***  Risk Assessment/Calculations:   {Does this patient have ATRIAL FIBRILLATION?:[id number]} No BP recorded.  {Refresh Note OR Click here to enter BP  :1}***        Physical Exam:   VS:  There were no vitals taken for this visit.   Wt Readings from Last 3 Encounters:  04/24/24 159 lb (72.1 kg)  09/05/23 159 lb (72.1 kg)  11/22/21 159 lb (72.1 kg)     GEN: Well nourished, well developed in no acute distress NECK: No JVD; No carotid bruits CARDIAC: ***RR, *** murmurs, rubs, gallops RESPIRATORY:  Clear to auscultation without rales, wheezing or rhonchi  ABDOMEN: Soft, non-tender, non-distended EXTREMITIES:  No edema; No deformity   ASSESSMENT AND PLAN:   ***    Abnormal EKG:  ***  Follow up ***  Signed, Lynwood Schilling, MD

## 2024-07-16 ENCOUNTER — Encounter: Payer: Self-pay | Admitting: Podiatry

## 2024-07-16 ENCOUNTER — Encounter: Payer: Self-pay | Admitting: Cardiology

## 2024-07-16 ENCOUNTER — Ambulatory Visit (INDEPENDENT_AMBULATORY_CARE_PROVIDER_SITE_OTHER): Admitting: Podiatry

## 2024-07-16 ENCOUNTER — Ambulatory Visit: Attending: Internal Medicine | Admitting: Cardiology

## 2024-07-16 VITALS — BP 140/88 | HR 83 | Ht 64.0 in | Wt 156.0 lb

## 2024-07-16 DIAGNOSIS — L539 Erythematous condition, unspecified: Secondary | ICD-10-CM | POA: Diagnosis not present

## 2024-07-16 DIAGNOSIS — R9431 Abnormal electrocardiogram [ECG] [EKG]: Secondary | ICD-10-CM | POA: Diagnosis not present

## 2024-07-16 DIAGNOSIS — L920 Granuloma annulare: Secondary | ICD-10-CM | POA: Diagnosis not present

## 2024-07-16 DIAGNOSIS — I7300 Raynaud's syndrome without gangrene: Secondary | ICD-10-CM

## 2024-07-16 DIAGNOSIS — L600 Ingrowing nail: Secondary | ICD-10-CM | POA: Diagnosis not present

## 2024-07-16 MED ORDER — CEPHALEXIN 500 MG PO CAPS: 500.0000 mg | ORAL_CAPSULE | Freq: Three times a day (TID) | ORAL | 0 refills | Status: AC

## 2024-07-16 NOTE — Patient Instructions (Signed)
 Medication Instructions:  Your physician recommends that you continue on your current medications as directed. Please refer to the Current Medication list given to you today.  *If you need a refill on your cardiac medications before your next appointment, please call your pharmacy*  Lab Work: NONE If you have labs (blood work) drawn today and your tests are completely normal, you will receive your results only by: MyChart Message (if you have MyChart) OR A paper copy in the mail If you have any lab test that is abnormal or we need to change your treatment, we will call you to review the results.  Testing/Procedures: Echocardiogram Your physician has requested that you have an echocardiogram. Echocardiography is a painless test that uses sound waves to create images of your heart. It provides your doctor with information about the size and shape of your heart and how well your heart's chambers and valves are working. This procedure takes approximately one hour. There are no restrictions for this procedure. Please do NOT wear cologne, perfume, aftershave, or lotions (deodorant is allowed). Please arrive 15 minutes prior to your appointment time.  Please note: We ask at that you not bring children with you during ultrasound (echo/ vascular) testing. Due to room size and safety concerns, children are not allowed in the ultrasound rooms during exams. Our front office staff cannot provide observation of children in our lobby area while testing is being conducted. An adult accompanying a patient to their appointment will only be allowed in the ultrasound room at the discretion of the ultrasound technician under special circumstances. We apologize for any inconvenience.   Follow-Up: At [facility], you and your health needs are our priority.  As part of our continuing mission to provide you with exceptional heart care, our providers are all part of one team.  This team includes your primary  Cardiologist (physician) and Advanced Practice Providers or APPs (Physician Assistants and Nurse Practitioners) who all work together to provide you with the care you need, when you need it.  Your next appointment:   As needed  Provider:   [place], MD  We recommend signing up for the patient portal called MyChart.  Sign up information is provided on this After Visit Summary.  MyChart is used to connect with patients for Virtual Visits (Telemedicine).  Patients are able to view lab/test results, encounter notes, upcoming appointments, etc.  Non-urgent messages can be sent to your provider as well.   To learn more about what you can do with MyChart, go to [email or web address].

## 2024-07-16 NOTE — Patient Instructions (Signed)

## 2024-07-16 NOTE — Progress Notes (Unsigned)
 Subjective: Chief Complaint  Patient presents with   Foot Pain    Patient is here for bilateral foot pain   43 year old female presents the office today with bilateral foot pain mostly to the toes.  She states they feel they are on fire.  She just tenderness around the toenail and she is concerned on ingrown toenails.  She also gets redness to the toes.  She states they have previously more of a purple discoloration.  Objective: AAO x3, NAD DP/PT pulses palpable bilaterally, CRT less than 3 seconds Localized erythema to the left fourth toe on the distal portion.  On the right foot there is erythema present to the second, third, fourth toes with tenderness palpation to all associated digits.  There is no purple discoloration or any evidence of gangrene.  No open lesions.  There is no drainage approximately toenail sites.  Mild ingrown toenails. No pain with calf compression, swelling, warmth, erythema  Assessment: Ingrown toenails with erythema but also concern for renounce  Plan: -All treatment options discussed with the patient including all alternatives, risks, complications.  -That should be debrided the nails without any complications or bleeding.  Will start cephalexin  given concern for possible infection.  I still think that all of her symptoms are likely result of Raynaud's given her history as well as the purple discoloration and now they are turning red.  She recently started on spironolactone which she has not started the medication.  Will discuss with her PCP possibly amlodipine  as she does use topical medications for Raynaud's with minimal improvement. -Referral to rheumatology given Raynaud's and history of GA -Patient encouraged to call the office with any questions, concerns, change in symptoms.   Return in about 3 weeks (around 08/06/2024), or if symptoms worsen or fail to improve.  Angel Casey DPM

## 2024-08-06 ENCOUNTER — Ambulatory Visit (HOSPITAL_COMMUNITY)
Admission: RE | Admit: 2024-08-06 | Discharge: 2024-08-06 | Disposition: A | Discharge status: Home / Self Care | Source: Ambulatory Visit | Attending: Student in an Organized Health Care Education/Training Program | Admitting: Student in an Organized Health Care Education/Training Program

## 2024-08-06 DIAGNOSIS — R9431 Abnormal electrocardiogram [ECG] [EKG]: Secondary | ICD-10-CM | POA: Insufficient documentation

## 2024-08-06 LAB — ECHOCARDIOGRAM COMPLETE: S' Lateral: 3.3 cm

## 2024-08-09 ENCOUNTER — Ambulatory Visit: Payer: Self-pay | Admitting: Cardiology

## 2024-08-12 NOTE — Progress Notes (Signed)
 Attempted to call patient for echo results. No answer left a vm to return to call back.   Patient never viewed in mychart. Printed, highlighted Dr Hochrein's comments and mailed to patient.

## 2024-09-14 ENCOUNTER — Ambulatory Visit: Payer: BC Managed Care – PPO | Admitting: Nurse Practitioner

## 2024-09-22 ENCOUNTER — Ambulatory Visit: Admitting: Nurse Practitioner

## 2024-09-22 NOTE — Progress Notes (Deleted)
" ° °  Angel Casey 08-28-1981 [id number]   History:  44 y.o. G0 presents for annual exam. Monthly cycle/Nuvaring. 2005 CIN-1, subsequent paps normal. 2024 right breast biopsy - fibroadenoma. H/O low Vit D.   Gynecologic History No LMP recorded.   Contraception/Family planning: NuvaRing vaginal inserts Sexually active: No  Health Maintenance Last Pap: 09/05/2023. Results were: Normal neg HPV Last mammogram: 06/15/2024. Results were: Normal Last colonoscopy: Not indicated Last Dexa: Not indicated     09/05/2023   10:17 AM  Depression screen PHQ 2/9  Decreased Interest 0  Down, Depressed, Hopeless 0  PHQ - 2 Score 0     Past medical history, past surgical history, family history and social history were all reviewed and documented in the EPIC chart. Single. Works in research officer, political party.   ROS:  A ROS was performed and pertinent positives and negatives are included.  Exam:  There were no vitals filed for this visit.    There is no height or weight on file to calculate BMI.  General appearance:  Normal Thyroid :  Symmetrical, normal in size, without palpable masses or nodularity. Respiratory  Auscultation:  Clear without wheezing or rhonchi Cardiovascular  Auscultation:  Initial listen it was regularly irregular, repeat at end of visit regular. without rubs, murmurs or gallops  Edema/varicosities:  Not grossly evident Abdominal  Soft,nontender, without masses, guarding or rebound.  Liver/spleen:  No organomegaly noted  Hernia:  None appreciated  Skin  Inspection:  Grossly normal   Breasts: Examined lying and sitting.   Right: Without masses, retractions, discharge or axillary adenopathy.   Left: Without masses, retractions, discharge or axillary adenopathy. Pelvic: External genitalia:  no lesions              Urethra:  normal appearing urethra with no masses, tenderness or lesions              Bartholins and Skenes: normal                 Vagina: normal appearing vagina  with normal color and discharge, no lesions              Cervix: no lesions Bimanual Exam:  Uterus:  no masses or tenderness              Adnexa: no mass, fullness, tenderness              Rectovaginal: Deferred              Anus:  normal, no lesions  Patient informed chaperone available to be present for breast and pelvic exam. Patient has requested no chaperone to be present. Patient has been advised what will be completed during breast and pelvic exam.   Assessment/Plan:  44 y.o. G0 for annual exam.   Well female exam with routine gynecological exam - Education provided on SBEs, importance of preventative screenings, current guidelines, high calcium diet, regular exercise, and multivitamin daily. Has PCP.   Encounter for surveillance of vaginal ring hormonal contraceptive device - Plan: etonogestrel -ethinyl estradiol  (NUVARING) 0.12-0.015 MG/24HR vaginal ring. She is using as prescribed. Refill x 1 year provided.   Cervical cancer screening - 2005 CIN-1, subsequent paps normal. Will repeat at 5-year interval per guidelines.   Screening for breast cancer - 10/2022 biopsy confirmed fibroadenoma. Normal breast exam today.  No follow-ups on file.     Angel Casey [facility], 10:28 AM 09/22/2024  "

## 2024-10-30 ENCOUNTER — Ambulatory Visit: Admitting: Internal Medicine

## 2024-11-26 ENCOUNTER — Ambulatory Visit: Admitting: Nurse Practitioner
